# Patient Record
Sex: Female | Born: 1945 | Race: White | Hispanic: No | Smoking: Never smoker
Health system: Southern US, Community
[De-identification: ages and names within clinical notes are randomized; demographics above are authoritative.]

## PROBLEM LIST (undated history)

## (undated) DIAGNOSIS — E119 Type 2 diabetes mellitus without complications: Secondary | ICD-10-CM

## (undated) DIAGNOSIS — T884XXA Failed or difficult intubation, initial encounter: Secondary | ICD-10-CM

## (undated) DIAGNOSIS — M1990 Unspecified osteoarthritis, unspecified site: Secondary | ICD-10-CM

## (undated) DIAGNOSIS — K219 Gastro-esophageal reflux disease without esophagitis: Secondary | ICD-10-CM

## (undated) DIAGNOSIS — J45909 Unspecified asthma, uncomplicated: Secondary | ICD-10-CM

## (undated) DIAGNOSIS — E785 Hyperlipidemia, unspecified: Secondary | ICD-10-CM

## (undated) DIAGNOSIS — I10 Essential (primary) hypertension: Secondary | ICD-10-CM

## (undated) DIAGNOSIS — R011 Cardiac murmur, unspecified: Secondary | ICD-10-CM

## (undated) DIAGNOSIS — Z8639 Personal history of other endocrine, nutritional and metabolic disease: Secondary | ICD-10-CM

## (undated) DIAGNOSIS — Z8719 Personal history of other diseases of the digestive system: Secondary | ICD-10-CM

## (undated) DIAGNOSIS — E039 Hypothyroidism, unspecified: Secondary | ICD-10-CM

## (undated) HISTORY — PX: CATARACT EXTRACTION: SUR2

---

## 2007-11-12 ENCOUNTER — Ambulatory Visit: Payer: Self-pay | Admitting: Internal Medicine

## 2007-11-23 ENCOUNTER — Ambulatory Visit: Payer: Self-pay | Admitting: Otolaryngology

## 2008-11-14 ENCOUNTER — Ambulatory Visit: Payer: Self-pay | Admitting: Internal Medicine

## 2009-03-22 ENCOUNTER — Ambulatory Visit: Payer: Self-pay | Admitting: General Practice

## 2009-11-30 ENCOUNTER — Ambulatory Visit: Payer: Self-pay | Admitting: Internal Medicine

## 2010-12-04 ENCOUNTER — Ambulatory Visit: Payer: Self-pay | Admitting: Internal Medicine

## 2011-12-05 ENCOUNTER — Ambulatory Visit: Payer: Self-pay | Admitting: Internal Medicine

## 2012-12-08 ENCOUNTER — Ambulatory Visit: Payer: Self-pay | Admitting: Internal Medicine

## 2012-12-29 ENCOUNTER — Ambulatory Visit: Payer: Self-pay | Admitting: Internal Medicine

## 2013-01-28 ENCOUNTER — Ambulatory Visit: Payer: Self-pay | Admitting: Internal Medicine

## 2013-02-28 ENCOUNTER — Ambulatory Visit: Payer: Self-pay | Admitting: Internal Medicine

## 2013-12-09 ENCOUNTER — Ambulatory Visit: Payer: Self-pay | Admitting: Internal Medicine

## 2014-05-19 DIAGNOSIS — E039 Hypothyroidism, unspecified: Secondary | ICD-10-CM | POA: Insufficient documentation

## 2014-05-19 DIAGNOSIS — E1169 Type 2 diabetes mellitus with other specified complication: Secondary | ICD-10-CM | POA: Insufficient documentation

## 2014-05-19 DIAGNOSIS — J302 Other seasonal allergic rhinitis: Secondary | ICD-10-CM | POA: Insufficient documentation

## 2014-05-19 DIAGNOSIS — E559 Vitamin D deficiency, unspecified: Secondary | ICD-10-CM | POA: Insufficient documentation

## 2014-05-20 DIAGNOSIS — J310 Chronic rhinitis: Secondary | ICD-10-CM | POA: Insufficient documentation

## 2014-05-20 DIAGNOSIS — T485X5A Adverse effect of other anti-common-cold drugs, initial encounter: Secondary | ICD-10-CM | POA: Insufficient documentation

## 2014-12-13 ENCOUNTER — Ambulatory Visit: Payer: Self-pay | Admitting: Internal Medicine

## 2015-11-27 ENCOUNTER — Other Ambulatory Visit: Payer: Self-pay | Admitting: Internal Medicine

## 2015-11-27 DIAGNOSIS — Z1231 Encounter for screening mammogram for malignant neoplasm of breast: Secondary | ICD-10-CM

## 2015-12-14 ENCOUNTER — Ambulatory Visit
Admission: RE | Admit: 2015-12-14 | Discharge: 2015-12-14 | Disposition: A | Discharge status: Home / Self Care | Payer: Medicare Other | Source: Ambulatory Visit | Attending: Internal Medicine | Admitting: Internal Medicine

## 2015-12-14 DIAGNOSIS — Z1231 Encounter for screening mammogram for malignant neoplasm of breast: Secondary | ICD-10-CM | POA: Diagnosis not present

## 2016-05-28 DIAGNOSIS — E1159 Type 2 diabetes mellitus with other circulatory complications: Secondary | ICD-10-CM | POA: Insufficient documentation

## 2016-05-28 DIAGNOSIS — I152 Hypertension secondary to endocrine disorders: Secondary | ICD-10-CM | POA: Insufficient documentation

## 2016-07-29 DIAGNOSIS — Z79899 Other long term (current) drug therapy: Secondary | ICD-10-CM | POA: Insufficient documentation

## 2016-11-27 ENCOUNTER — Other Ambulatory Visit: Payer: Self-pay | Admitting: Internal Medicine

## 2016-11-27 DIAGNOSIS — Z1231 Encounter for screening mammogram for malignant neoplasm of breast: Secondary | ICD-10-CM

## 2016-12-16 ENCOUNTER — Ambulatory Visit
Admission: RE | Admit: 2016-12-16 | Discharge: 2016-12-16 | Disposition: A | Discharge status: Home / Self Care | Payer: Medicare Other | Source: Ambulatory Visit | Attending: Internal Medicine | Admitting: Internal Medicine

## 2016-12-16 DIAGNOSIS — Z1231 Encounter for screening mammogram for malignant neoplasm of breast: Secondary | ICD-10-CM | POA: Diagnosis present

## 2017-11-26 ENCOUNTER — Other Ambulatory Visit: Payer: Self-pay | Admitting: Internal Medicine

## 2017-11-26 DIAGNOSIS — Z1239 Encounter for other screening for malignant neoplasm of breast: Secondary | ICD-10-CM

## 2017-11-29 DIAGNOSIS — Z803 Family history of malignant neoplasm of breast: Secondary | ICD-10-CM | POA: Insufficient documentation

## 2017-12-17 ENCOUNTER — Ambulatory Visit
Admission: RE | Admit: 2017-12-17 | Discharge: 2017-12-17 | Disposition: A | Discharge status: Home / Self Care | Payer: Medicare Other | Source: Ambulatory Visit | Attending: Internal Medicine | Admitting: Internal Medicine

## 2017-12-17 DIAGNOSIS — Z1239 Encounter for other screening for malignant neoplasm of breast: Secondary | ICD-10-CM

## 2017-12-17 DIAGNOSIS — Z1231 Encounter for screening mammogram for malignant neoplasm of breast: Secondary | ICD-10-CM | POA: Insufficient documentation

## 2017-12-31 DIAGNOSIS — H50011 Monocular esotropia, right eye: Secondary | ICD-10-CM | POA: Insufficient documentation

## 2018-06-03 DIAGNOSIS — G5603 Carpal tunnel syndrome, bilateral upper limbs: Secondary | ICD-10-CM | POA: Insufficient documentation

## 2018-10-29 ENCOUNTER — Other Ambulatory Visit: Payer: Self-pay | Admitting: Physician Assistant

## 2018-10-29 ENCOUNTER — Other Ambulatory Visit (HOSPITAL_COMMUNITY): Payer: Self-pay | Admitting: Physician Assistant

## 2018-10-29 DIAGNOSIS — H918X3 Other specified hearing loss, bilateral: Secondary | ICD-10-CM

## 2018-10-29 DIAGNOSIS — H918X9 Other specified hearing loss, unspecified ear: Secondary | ICD-10-CM

## 2018-11-10 ENCOUNTER — Ambulatory Visit: Payer: Medicare Other

## 2018-11-10 ENCOUNTER — Ambulatory Visit
Admission: RE | Admit: 2018-11-10 | Discharge: 2018-11-10 | Disposition: A | Discharge status: Home / Self Care | Payer: Medicare Other | Source: Ambulatory Visit | Attending: Physician Assistant | Admitting: Physician Assistant

## 2018-11-10 DIAGNOSIS — H918X9 Other specified hearing loss, unspecified ear: Secondary | ICD-10-CM | POA: Diagnosis not present

## 2018-12-03 ENCOUNTER — Other Ambulatory Visit: Payer: Self-pay | Admitting: Internal Medicine

## 2018-12-03 DIAGNOSIS — Z1231 Encounter for screening mammogram for malignant neoplasm of breast: Secondary | ICD-10-CM

## 2018-12-21 ENCOUNTER — Ambulatory Visit: Payer: Medicare Other

## 2019-01-25 ENCOUNTER — Ambulatory Visit: Payer: Medicare Other

## 2019-03-02 ENCOUNTER — Other Ambulatory Visit: Payer: Self-pay

## 2019-03-02 ENCOUNTER — Ambulatory Visit
Admission: RE | Admit: 2019-03-02 | Discharge: 2019-03-02 | Disposition: A | Discharge status: Home / Self Care | Payer: Medicare Other | Source: Ambulatory Visit | Attending: Internal Medicine | Admitting: Internal Medicine

## 2019-03-02 DIAGNOSIS — Z1231 Encounter for screening mammogram for malignant neoplasm of breast: Secondary | ICD-10-CM | POA: Diagnosis present

## 2019-12-09 ENCOUNTER — Other Ambulatory Visit: Payer: Self-pay | Admitting: Internal Medicine

## 2019-12-09 DIAGNOSIS — Z1231 Encounter for screening mammogram for malignant neoplasm of breast: Secondary | ICD-10-CM

## 2020-03-02 ENCOUNTER — Other Ambulatory Visit: Payer: Self-pay

## 2020-03-02 ENCOUNTER — Ambulatory Visit
Admission: RE | Admit: 2020-03-02 | Discharge: 2020-03-02 | Disposition: A | Discharge status: Home / Self Care | Payer: Medicare PPO | Source: Ambulatory Visit | Attending: Internal Medicine | Admitting: Internal Medicine

## 2020-03-02 DIAGNOSIS — Z1231 Encounter for screening mammogram for malignant neoplasm of breast: Secondary | ICD-10-CM | POA: Diagnosis not present

## 2020-12-07 ENCOUNTER — Other Ambulatory Visit: Payer: Self-pay | Admitting: Internal Medicine

## 2020-12-07 DIAGNOSIS — Z1231 Encounter for screening mammogram for malignant neoplasm of breast: Secondary | ICD-10-CM

## 2021-03-05 ENCOUNTER — Other Ambulatory Visit: Payer: Self-pay

## 2021-03-05 ENCOUNTER — Ambulatory Visit
Admission: RE | Admit: 2021-03-05 | Discharge: 2021-03-05 | Disposition: A | Discharge status: Home / Self Care | Payer: Medicare PPO | Source: Ambulatory Visit | Attending: Internal Medicine | Admitting: Internal Medicine

## 2021-03-05 DIAGNOSIS — Z1231 Encounter for screening mammogram for malignant neoplasm of breast: Secondary | ICD-10-CM | POA: Diagnosis not present

## 2021-09-01 IMAGING — MG MM DIGITAL SCREENING BILAT W/ TOMO AND CAD
8 series · 8 of 24 positions shown · non-contrast
Comparison: Previous exam(s).

CLINICAL DATA: Screening.

EXAM:
DIGITAL SCREENING BILATERAL MAMMOGRAM WITH TOMOSYNTHESIS AND CAD
TECHNIQUE: Bilateral screening digital craniocaudal and mediolateral oblique
mammograms were obtained. Bilateral screening digital breast
tomosynthesis was performed. The images were evaluated with
computer-aided detection.

[L CC synth-2D]
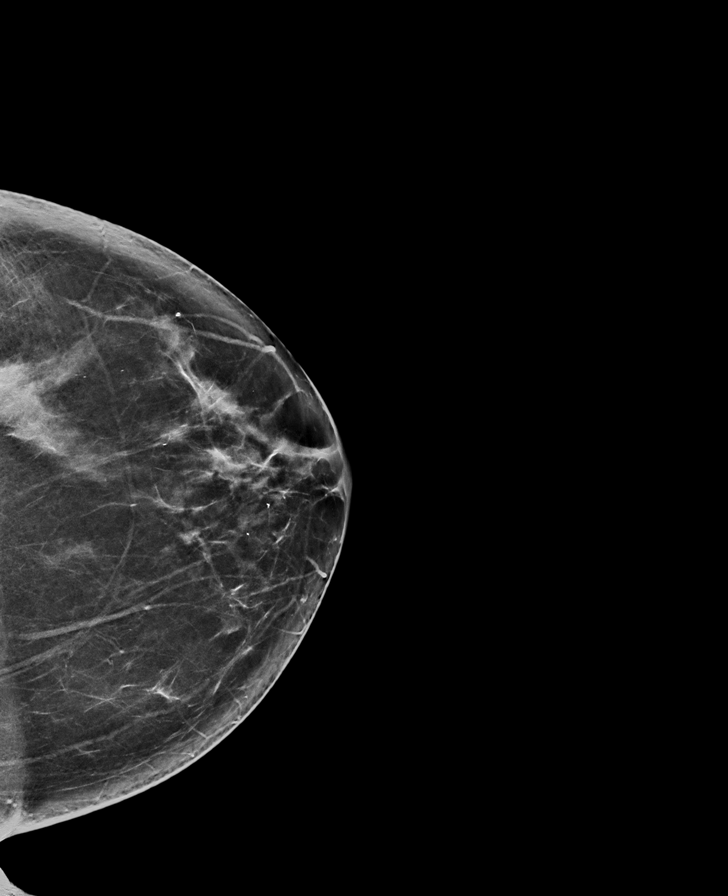

[L MLO synth-2D]
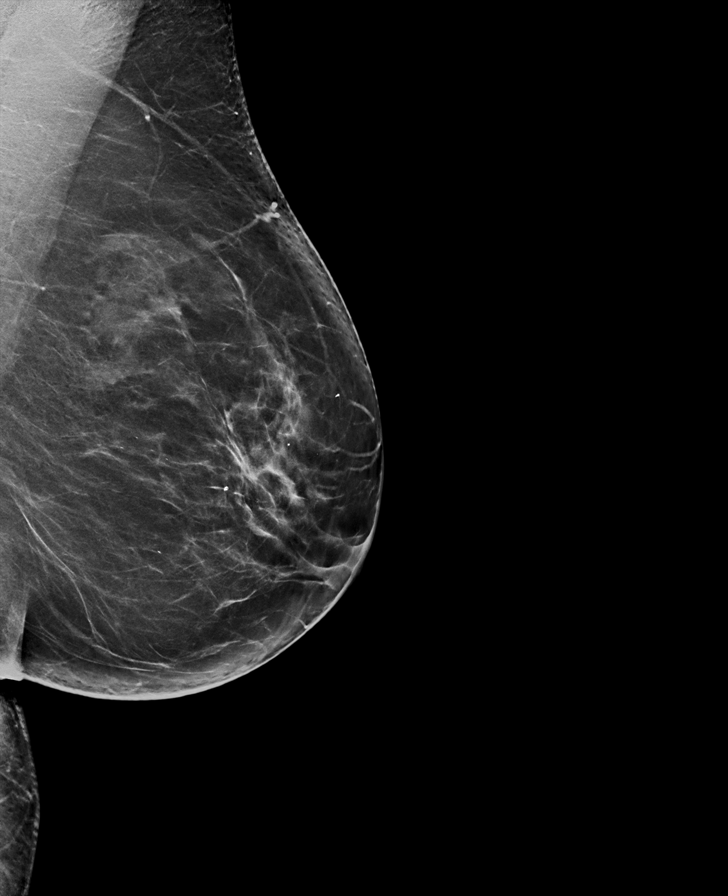

[R MLO synth-2D]
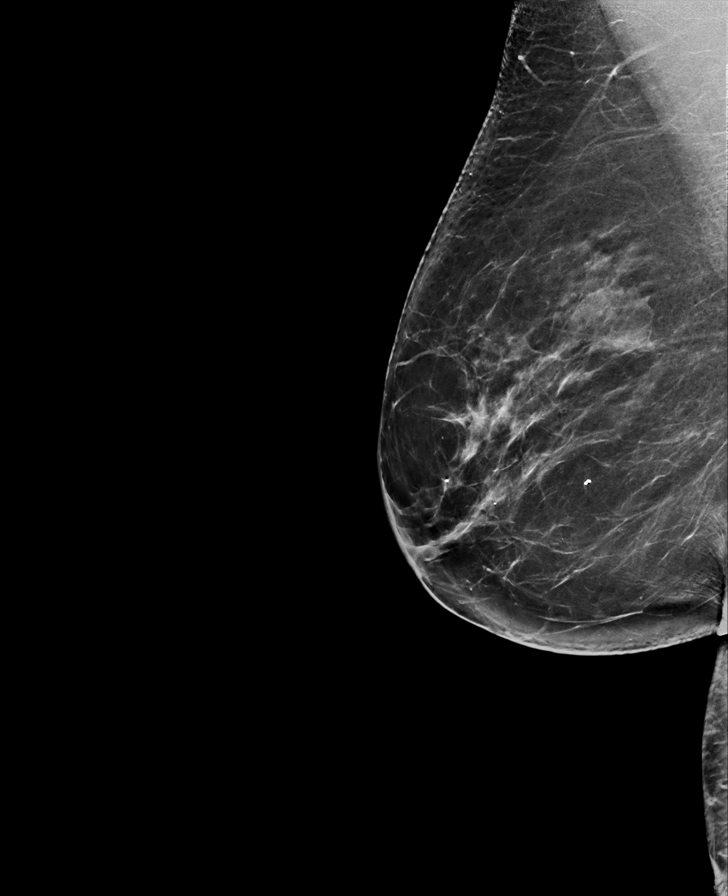

[R CC synth-2D]
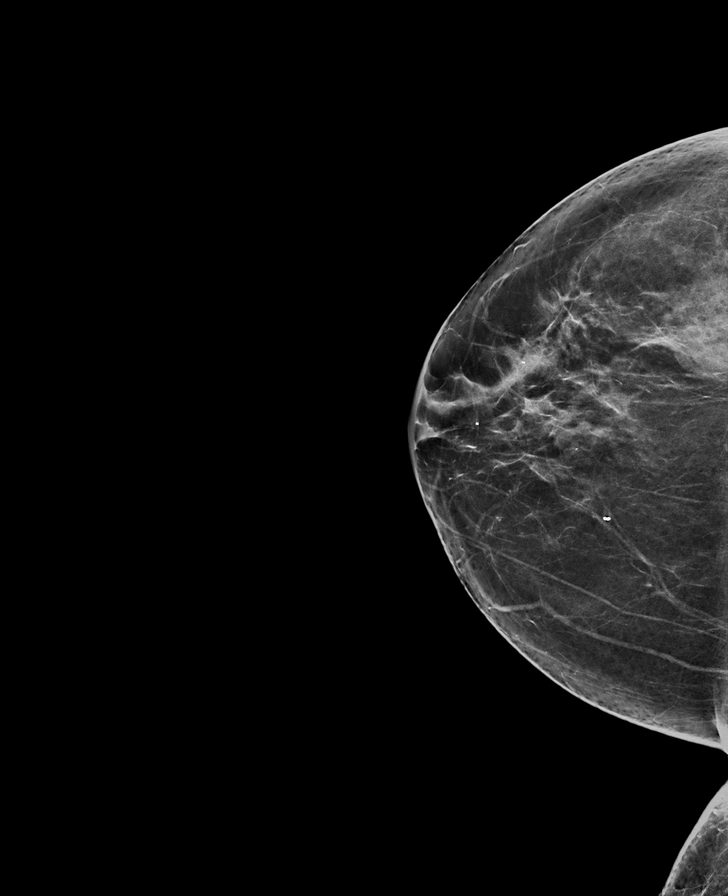

[R CC tomo · tomo slice 35/68.0]
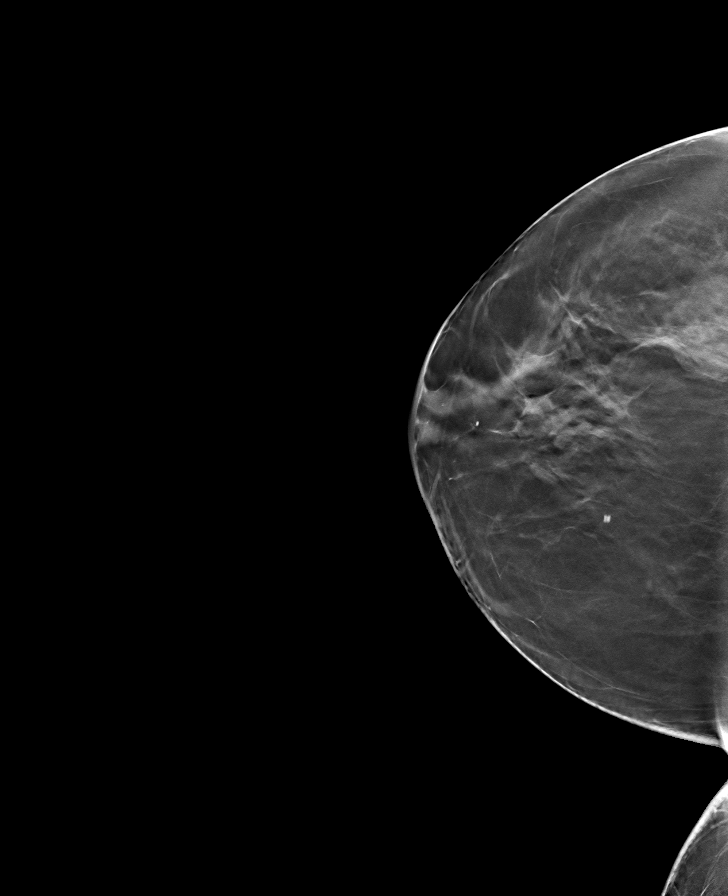

[R MLO tomo · tomo slice 40/79.0]
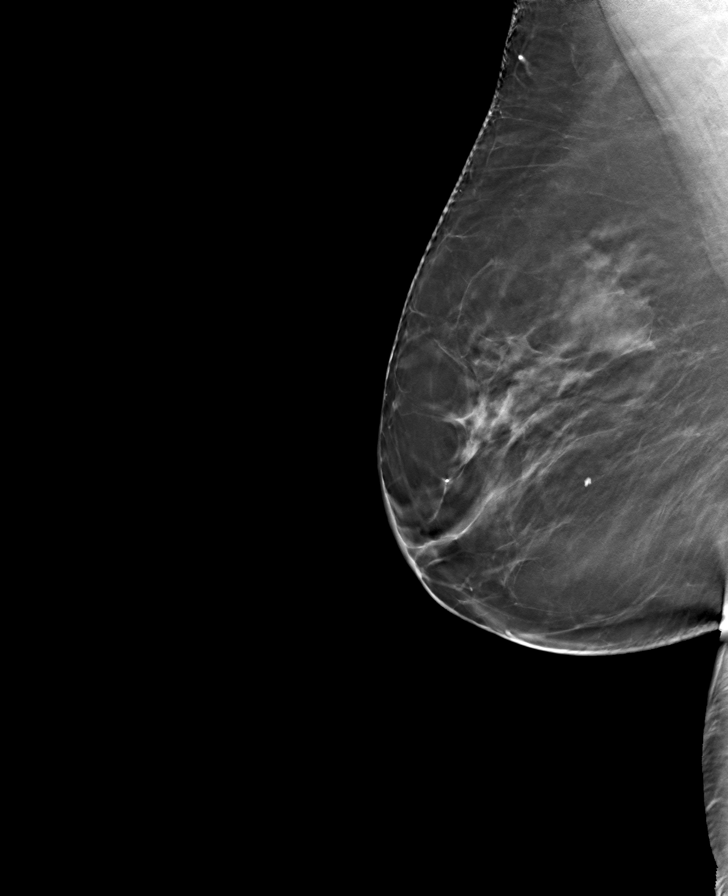

[L MLO tomo · tomo slice 41/81.0]
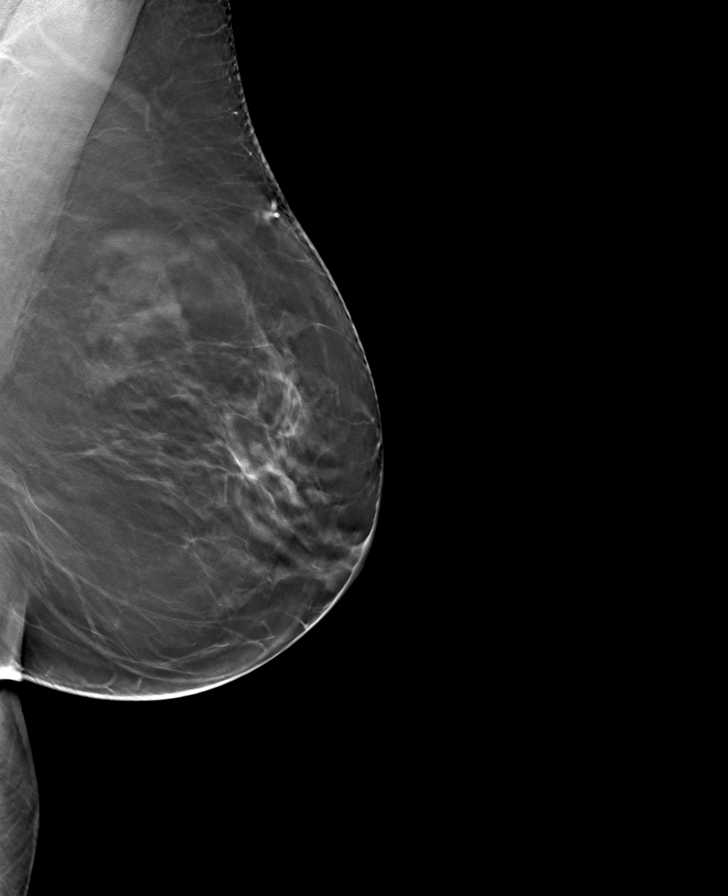

[L CC tomo · tomo slice 38/75.0]
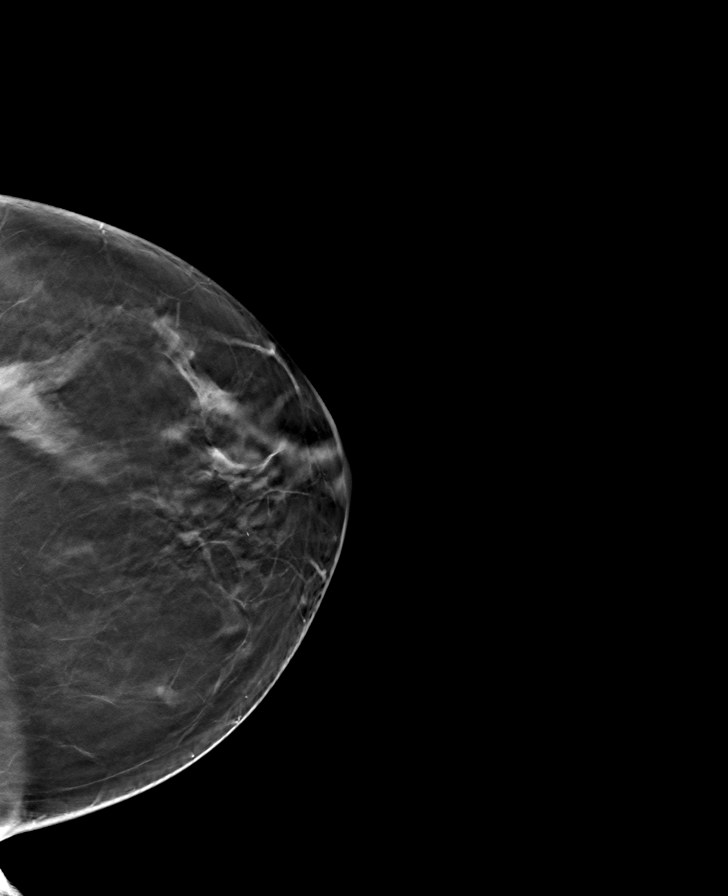

[8 of 24 positions shown; findings below may reference images not displayed]

ACR Breast Density Category b: There are scattered areas of
fibroglandular density.
FINDINGS: There are no findings suspicious for malignancy. The images were
evaluated with computer-aided detection.
IMPRESSION: No mammographic evidence of malignancy. A result letter of this
screening mammogram will be mailed directly to the patient.

RECOMMENDATION:
Screening mammogram in one year. (Code:[id number])

BI-RADS CATEGORY  1: Negative.

## 2021-09-30 DIAGNOSIS — M1612 Unilateral primary osteoarthritis, left hip: Secondary | ICD-10-CM | POA: Insufficient documentation

## 2021-12-01 NOTE — Discharge Instructions (Signed)
Instructions after Total Hip Replacement     Tina Best, Jr., M.D.     Dept. of Orthopaedics & Sports Medicine  [facility]  [address]  [place], [place]  [address]  Phone: [phone]   Fax: [phone]    DIET: . Drink plenty of non-alcoholic fluids. . Resume your normal diet. Include foods high in fiber.  ACTIVITY:  . You may use crutches or a walker with weight-bearing as tolerated, unless instructed otherwise. . You may be weaned off of the walker or crutches by your Physical Therapist.  . Do NOT reach below the level of your knees or cross your legs until allowed.    . Continue doing gentle exercises. Exercising will reduce the pain and swelling, increase motion, and prevent muscle weakness.   . Please continue to use the TED compression stockings for 6 weeks. You may remove the stockings at night, but should reapply them in the morning. . Do not drive or operate any equipment until instructed.  WOUND CARE:  . Continue to use ice packs periodically to reduce pain and swelling. . Keep the incision clean and dry. . You may bathe or shower after the staples are removed at the first office visit following surgery.  MEDICATIONS: . You may resume your regular medications. . Please take the pain medication as prescribed on the medication. . Do not take pain medication on an empty stomach. . You have been given a prescription for a blood thinner to prevent blood clots. Please take the medication as instructed. (NOTE: After completing a 2 week course of Lovenox, take one Enteric-coated aspirin once a day.) . Pain medications and iron supplements can cause constipation. Use a stool softener (Senokot or Colace) on a daily basis and a laxative (dulcolax or miralax) as needed. . Do not drive or drink alcoholic beverages when taking pain medications.  CALL THE OFFICE FOR: . Temperature above 101 degrees . Excessive bleeding or drainage on the dressing. . Excessive  swelling, coldness, or paleness of the toes. . Persistent nausea and vomiting.  FOLLOW-UP:  . You should have an appointment to return to the office in 6 weeks after surgery. . Arrangements have been made for continuation of Physical Therapy (either home therapy or outpatient therapy).     [facility] Department Directory         [email or web address]       [email or web address]          Cardiology  Appointments: [place] - [phone] [place] - [phone]  Endocrinology  Appointments: [place] - [phone] [place] - [phone]  Gastroenterology  Appointments: [place] - [phone] [place] - [phone]        General Surgery   Appointments: [place] - [phone]  Internal Medicine/Family Medicine  Appointments: [place] - [phone] [place] - [phone] [place] - [phone]  Metabolic and Weigh Loss Surgery  Appointments: [place] - [phone]        Neurology  Appointments: [place] - [phone] [place] - [phone]  Neurosurgery  Appointments: [place] - [phone]  Obstetrics & Gynecology  Appointments: [place] - [phone] [place] - [phone]        Pediatrics  Appointments: [place] - [phone] [place] - [phone]  Physiatry  Appointments: [place] -[phone]  Physical Therapy  Appointments: [place] - [phone] [place] - [phone]        Podiatry  Appointments: [place] - [phone] [place] - [phone]  Pulmonology  Appointments: [place] - [phone]  Rheumatology  Appointments: [place] - [phone]         Location: [facility]  [address] [place], [place]  [address]  [place] Location: [facility] [address] [place], [place]  [address]  [place] Location: [facility] [address] [place], [place]  [address]    

## 2021-12-10 ENCOUNTER — Encounter
Admission: RE | Admit: 2021-12-10 | Discharge: 2021-12-10 | Disposition: A | Discharge status: Home / Self Care | Payer: Medicare PPO | Source: Ambulatory Visit | Attending: Orthopedic Surgery | Admitting: Orthopedic Surgery

## 2021-12-10 ENCOUNTER — Other Ambulatory Visit: Payer: Self-pay

## 2021-12-10 DIAGNOSIS — Z01818 Encounter for other preprocedural examination: Secondary | ICD-10-CM

## 2021-12-10 HISTORY — DX: Unspecified osteoarthritis, unspecified site: M19.90

## 2021-12-10 HISTORY — DX: Cardiac murmur, unspecified: R01.1

## 2021-12-10 HISTORY — DX: Failed or difficult intubation, initial encounter: T88.4XXA

## 2021-12-10 HISTORY — DX: Gastro-esophageal reflux disease without esophagitis: K21.9

## 2021-12-10 HISTORY — DX: Personal history of other endocrine, nutritional and metabolic disease: Z86.39

## 2021-12-10 HISTORY — DX: Hyperlipidemia, unspecified: E78.5

## 2021-12-10 HISTORY — DX: Essential (primary) hypertension: I10

## 2021-12-10 HISTORY — DX: Personal history of other diseases of the digestive system: Z87.19

## 2021-12-10 HISTORY — DX: Hypothyroidism, unspecified: E03.9

## 2021-12-10 HISTORY — DX: Unspecified asthma, uncomplicated: J45.909

## 2021-12-10 HISTORY — DX: Type 2 diabetes mellitus without complications: E11.9

## 2021-12-10 NOTE — Patient Instructions (Addendum)
Your procedure is scheduled on:12-24-21 Monday ?Report to the Registration Desk on the 1st floor of the Medical Mall.Then proceed to the 2nd floor Surgery Desk in the Medical Mall ?To find out your arrival time, please call [phone] between 1PM - 3PM on:12-21-21 Friday ? ?REMEMBER: ?Instructions that are not followed completely may result in serious medical risk, up to and including death; or upon the discretion of your surgeon and anesthesiologist your surgery may need to be rescheduled. ? ?Do not eat food after midnight the night before surgery.  ?No gum chewing, lozengers or hard candies. ? ?You may however, drink Water up to 2 hours before you are scheduled to arrive for your surgery. Do not drink anything within 2 hours of your scheduled arrival time. ? ?Type 1 and Type 2 diabetics should only drink water. ? ?In addition, your doctor has ordered for you to drink the provided  ?Gatorade G2 ?Drinking this carbohydrate drink up to two hours before surgery helps to reduce insulin resistance and improve patient outcomes. Please complete drinking 2 hours prior to scheduled arrival time. ? ?TAKE THESE MEDICATIONS THE MORNING OF SURGERY WITH A SIP OF WATER: ?-levothyroxine (SYNTHROID) ?-loratadine (CLARITIN) ? ?Stop your metFORMIN (GLUCOPHAGE-XR) 2 days prior to surgery-Last dose on 12-21-21 Friday ? ?Stop your Aspirin 7 days prior to surgery-Last dose will be on 12-16-21 Sunday ? ?One week prior to surgery:Last dose on 12-16-21 ?Stop Anti-inflammatories (NSAIDS) such as Advil, Aleve, Ibuprofen, Motrin, Naproxen, Naprosyn and Aspirin based products such as Excedrin, Goodys Powder, BC Powder.You may however, continue to take Tylenol if needed for pain up until the day of surgery. ? ?Stop ANY OVER THE COUNTER supplements/vitamins 7 days prior to surgery (Calcium Carbonate-Vitamin D, VITAMIN D3, Lysine , Multiple Vitamin , Omega-3 Fatty Acids (FISH OIL), Vitamin E ) ? ?No Alcohol for 24 hours before or after  surgery. ? ?No Smoking including e-cigarettes for 24 hours prior to surgery.  ?No chewable tobacco products for at least 6 hours prior to surgery.  ?No nicotine patches on the day of surgery. ? ?Do not use any "recreational" drugs for at least a week prior to your surgery.  ?Please be advised that the combination of cocaine and anesthesia may have negative outcomes, up to and including death. ?If you test positive for cocaine, your surgery will be cancelled. ? ?On the morning of surgery brush your teeth with toothpaste and water, you may rinse your mouth with mouthwash if you wish. ?Do not swallow any toothpaste or mouthwash. ? ?Use CHG Soap as directed on instruction sheet. ? ?Do not wear jewelry, make-up, hairpins, clips or nail polish. ? ?Do not wear lotions, powders, or perfumes.  ? ?Do not shave body from the neck down 48 hours prior to surgery just in case you cut yourself which could leave a site for infection.  ?Also, freshly shaved skin may become irritated if using the CHG soap. ? ?Contact lenses, hearing aids and dentures may not be worn into surgery. ? ?Do not bring valuables to the hospital. [facility] is not responsible for any missing/lost belongings or valuables.  ? ?Notify your doctor if there is any change in your medical condition (cold, fever, infection). ? ?Wear comfortable clothing (specific to your surgery type) to the hospital. ? ?After surgery, you can help prevent lung complications by doing breathing exercises.  ?Take deep breaths and cough every 1-2 hours. Your doctor may order a device called an Incentive Spirometer to help you take deep breaths. ?When  coughing or sneezing, hold a pillow firmly against your incision with both hands. This is called ?splinting.? Doing this helps protect your incision. It also decreases belly discomfort. ? ?If you are being admitted to the hospital overnight, leave your suitcase in the car. ?After surgery it may be brought to your room. ? ?If you are being  discharged the day of surgery, you will not be allowed to drive home. ?You will need a responsible adult (18 years or older) to drive you home and stay with you that night.  ? ?If you are taking public transportation, you will need to have a responsible adult (18 years or older) with you. ?Please confirm with your physician that it is acceptable to use public transportation.  ? ?Please call the Pre-admissions Testing Dept. at [phone] if you have any questions about these instructions. ? ?Surgery Visitation Policy: ? ?Patients undergoing a surgery or procedure may have one family member or support person with them as long as that person is not COVID-19 positive or experiencing its symptoms.  ?That person may remain in the waiting area during the procedure and may rotate out with other people. ? ?Inpatient Visitation:   ? ?Visiting hours are 7 a.m. to 8 p.m. ?Up to two visitors ages 16+ are allowed at one time in a patient room. The visitors may rotate out with other people during the day. Visitors must check out when they leave, or other visitors will not be allowed. One designated support person may remain overnight. ?The visitor must pass COVID-19 screenings, use hand sanitizer when entering and exiting the patient?s room and wear a mask at all times, including in the patient?s room. ?Patients must also wear a mask when staff or their visitor are in the room. ?Masking is required regardless of vaccination status.  ?

## 2021-12-11 ENCOUNTER — Other Ambulatory Visit
Admission: RE | Admit: 2021-12-11 | Discharge: 2021-12-11 | Disposition: A | Discharge status: Home / Self Care | Payer: Medicare PPO | Source: Ambulatory Visit | Attending: Orthopedic Surgery | Admitting: Orthopedic Surgery

## 2021-12-11 DIAGNOSIS — Z01818 Encounter for other preprocedural examination: Secondary | ICD-10-CM

## 2021-12-11 DIAGNOSIS — Z01812 Encounter for preprocedural laboratory examination: Secondary | ICD-10-CM | POA: Insufficient documentation

## 2021-12-11 DIAGNOSIS — M1612 Unilateral primary osteoarthritis, left hip: Secondary | ICD-10-CM | POA: Insufficient documentation

## 2021-12-11 DIAGNOSIS — E119 Type 2 diabetes mellitus without complications: Secondary | ICD-10-CM | POA: Insufficient documentation

## 2021-12-11 DIAGNOSIS — Z01810 Encounter for preprocedural cardiovascular examination: Secondary | ICD-10-CM | POA: Diagnosis not present

## 2021-12-11 LAB — URINALYSIS, ROUTINE W REFLEX MICROSCOPIC
Bilirubin Urine: NEGATIVE
Glucose, UA: NEGATIVE mg/dL
Hgb urine dipstick: NEGATIVE
Ketones, ur: NEGATIVE mg/dL
Leukocytes,Ua: NEGATIVE
Nitrite: NEGATIVE
Protein, ur: NEGATIVE mg/dL
Specific Gravity, Urine: 1.021 (ref 1.005–1.030)
pH: 5 (ref 5.0–8.0)

## 2021-12-11 LAB — COMPREHENSIVE METABOLIC PANEL
ALT: 15 U/L (ref 0–44)
AST: 17 U/L (ref 15–41)
Albumin: 4.2 g/dL (ref 3.5–5.0)
Alkaline Phosphatase: 62 U/L (ref 38–126)
Anion gap: 10 (ref 5–15)
BUN: 33 mg/dL — ABNORMAL HIGH (ref 8–23)
CO2: 24 mmol/L (ref 22–32)
Calcium: 9.8 mg/dL (ref 8.9–10.3)
Chloride: 102 mmol/L (ref 98–111)
Creatinine, Ser: 1.4 mg/dL — ABNORMAL HIGH (ref 0.44–1.00)
GFR, Estimated: 39 mL/min — ABNORMAL LOW (ref >60)
Glucose, Bld: 138 mg/dL — ABNORMAL HIGH (ref 70–99)
Potassium: 3.9 mmol/L (ref 3.5–5.1)
Sodium: 136 mmol/L (ref 135–145)
Total Bilirubin: 0.4 mg/dL (ref 0.3–1.2)
Total Protein: 7.1 g/dL (ref 6.5–8.1)

## 2021-12-11 LAB — CBC WITH DIFFERENTIAL/PLATELET
Abs Immature Granulocytes: 0.03 10*3/uL (ref 0.00–0.07)
Basophils Absolute: 0 10*3/uL (ref 0.0–0.1)
Basophils Relative: 0 %
Eosinophils Absolute: 0.3 10*3/uL (ref 0.0–0.5)
Eosinophils Relative: 3 %
HCT: 35 % — ABNORMAL LOW (ref 36.0–46.0)
Hemoglobin: 11.5 g/dL — ABNORMAL LOW (ref 12.0–15.0)
Immature Granulocytes: 0 %
Lymphocytes Relative: 29 %
Lymphs Abs: 2.8 10*3/uL (ref 0.7–4.0)
MCH: 31.1 pg (ref 26.0–34.0)
MCHC: 32.9 g/dL (ref 30.0–36.0)
MCV: 94.6 fL (ref 80.0–100.0)
Monocytes Absolute: 0.7 10*3/uL (ref 0.1–1.0)
Monocytes Relative: 8 %
Neutro Abs: 5.8 10*3/uL (ref 1.7–7.7)
Neutrophils Relative %: 60 %
Platelets: 316 10*3/uL (ref 150–400)
RBC: 3.7 MIL/uL — ABNORMAL LOW (ref 3.87–5.11)
RDW: 13.2 % (ref 11.5–15.5)
WBC: 9.7 10*3/uL (ref 4.0–10.5)
nRBC: 0 % (ref 0.0–0.2)

## 2021-12-11 LAB — TYPE AND SCREEN
ABO/RH(D): A POS
Antibody Screen: NEGATIVE

## 2021-12-11 LAB — SEDIMENTATION RATE: Sed Rate: 2 mm/hr (ref 0–30)

## 2021-12-11 LAB — SURGICAL PCR SCREEN
MRSA, PCR: NEGATIVE
Staphylococcus aureus: POSITIVE — AB

## 2021-12-11 LAB — C-REACTIVE PROTEIN: CRP: 0.8 mg/dL (ref <1.0)

## 2021-12-12 LAB — HEMOGLOBIN A1C
Hgb A1c MFr Bld: 7.2 % — ABNORMAL HIGH (ref 4.8–5.6)
Mean Plasma Glucose: 160 mg/dL

## 2021-12-17 ENCOUNTER — Other Ambulatory Visit: Payer: Medicare PPO

## 2021-12-21 ENCOUNTER — Other Ambulatory Visit: Admission: RE | Admit: 2021-12-21 | Payer: Medicare PPO | Source: Ambulatory Visit

## 2021-12-23 ENCOUNTER — Encounter: Payer: Self-pay | Admitting: Orthopedic Surgery

## 2021-12-23 DIAGNOSIS — H25019 Cortical age-related cataract, unspecified eye: Secondary | ICD-10-CM | POA: Insufficient documentation

## 2021-12-23 NOTE — H&P (Signed)
ORTHOPAEDIC HISTORY & PHYSICAL ?Tina Best, Tina Best, [name] - 12/13/2021 9:45 AM EDT ?Formatting of this note is different from the original. ?[facility] ?ORTHOPAEDICS AND SPORTS MEDICINE ?Chief Complaint:  ? ?Chief Complaint  ?Patient presents with  ? Hip Pain  ?H & P LEFT HIP  ? ?History of Present Illness:  ? ?Tina Best is a 76 y.o. female that presents to clinic today for her preoperative history and evaluation. The patient is scheduled to undergo a left total hip arthroplasty on 12/24/21 by Dr. Ernest Best. Her pain began several years ago with worsening over the last several months. The pain is located primarily in the left hip. She describes her pain as worse with weightbearing. She reports associated difficulty removing and putting on her shoes and socks. She denies associated numbness or tingling.  ? ?The patient's symptoms have progressed to the point that they decrease her quality of life. The patient has previously undergone conservative treatment including NSAIDS and activity modification without adequate control of her symptoms. ? ?Denies significant cardiac history, history of DVT, or lumbar surgery.  ? ?Past Medical, Surgical, Family, Social History, Allergies, Medications:  ? ?Past Medical History:  ?Past Medical History:  ?Diagnosis Date  ? Allergy about 1960  ?"hay fever" & allergic to Breathine  ? Arthritis 2015  ?left knee  ? Cataract cortical, senile  ?BILATERAL  ? Chicken pox  ? Essential hypertension 05/27/2016  ?Started on medication 05/27/2016  ? GERD (gastroesophageal reflux disease)  ?infrequent  ? History of Hashimoto thyroiditis  ? History of herpes zoster  ? History of hyperparathyroidism  ? Hyperlipidemia  ? Hypothyroidism (acquired)  ? Perennial allergic rhinitis  ? Type 2 diabetes mellitus (CMS-HCC)  ? Vitamin D deficiency 05/19/2014  ? ?Past Surgical History:  ?Past Surgical History:  ?Procedure Laterality Date  ? TONSILLECTOMY 1967  ? RADIAL Keratotomy 1985  ?  PARATHYROIDECTOMY 2009  ? RIGHT TRIGGER THUMB RELEASE 03/22/2009  ? CATARACT EXTRACTION Jan. 2019  ?[facility]  ? EXTRACTION CATARACT EXTRACAPSULAR Right 10/14/2017  ?Procedure: Lensx/ TORIC-----EXTRACTION CATARACT EXTRACAPSULAR WITH PHACO; Surgeon: Tina Best, Terry, MD; [facility]; Service: Ophthalmology; Laterality: Right; [phone] with trypan blue and possible MiLoop  ? EXTRACTION CATARACT EXTRACAPSULAR Left 10/28/2017  ?Procedure: Lensx/ TORIC-----EXTRACTION CATARACT EXTRACAPSULAR WITH PHACO; Surgeon: Tina Best, Terry, MD; [facility]; Service: Ophthalmology; Laterality: Left; [id number] with trypan blue and possible MiLoop  ? ?Current Medications:  ?Current Outpatient Medications  ?Medication Sig Dispense Refill  ? aspirin 81 MG EC tablet Take 81 mg by mouth once daily.  ? blood glucose diagnostic test strip Use once daily. Use as instructed. 100 each 3  ? CALCIUM CARBONATE (TUMS ORAL) Take 3 tablets by mouth as needed  ? CALCIUM CARBONATE/VITAMIN D3 (CALTRATE-600 + D VIT D3, 800, ORAL) Take 1 tablet by mouth once daily.  ? cholecalciferol (VITAMIN D3) 1,000 unit capsule Take 1,000 Units by mouth 2 (two) times daily.  ? hydroCHLOROthiazide (HYDRODIURIL) 25 MG tablet Take 1 tablet (25 mg total) by mouth once daily for blood pressure 90 tablet 3  ? ibuprofen (MOTRIN) 200 MG tablet Take 200 mg by mouth 5 IN THE AM, 2 AS NEEDED THROUGHOUT THE DAY  ? lancets 30 gauge Misc Use once daily.  ? levothyroxine (SYNTHROID) 112 MCG tablet Take on an empty stomach at least 30-60 minutes before breakfast. 90 tablet 3  ? loperamide (IMODIUM A-D) 2 mg tablet Take 2 mg by mouth as needed for Diarrhea  ? loratadine (CLARITIN) 10  mg tablet Take 10 mg by mouth once daily as needed for Allergies.  ? lysine 500 mg Tab Take by mouth once daily.  ? metFORMIN (GLUCOPHAGE-XR) 500 MG XR tablet Take 2 tablets (1,000 mg total) by mouth 2 (two) times daily with meals for diabetes 360 tablet 3  ? MULTIVITS W-FE,OTHER MIN/LUT (CENTRUM  SILVER ULTRA WOMEN'S ORAL) Take by mouth once daily.  ? omega-3 fatty acids/fish oil 340-1,000 mg capsule Take 1 capsule by mouth once daily.  ? oxymetazoline (AFRIN) 0.05 % nasal spray Place 1 spray into both nostrils every 8 (eight) hours as needed for Congestion  ? peg 400-propylene glycol, PF, (SYSTANE ULTRA) 0.4-0.3 % ophthalmic drops Place 1 drop into both eyes as needed for Dry Eyes  ? simvastatin (ZOCOR) 20 MG tablet TAKE 1 TABLET (20 MG TOTAL) BY MOUTH NIGHTLY FOR CHOLESTEROL 90 tablet 3  ? telmisartan (MICARDIS) 80 MG tablet Take 1 tablet (80 mg total) by mouth once daily for blood pressure 90 tablet 3  ? vitamin E 200 UNIT capsule Take 200 Units by mouth once daily.  ? montelukast (SINGULAIR) 10 mg tablet Take 1 tablet (10 mg total) by mouth nightly as needed for allergies 90 tablet 2  ? ?No current facility-administered medications for this visit.  ? ?Allergies:  ?Allergies  ?Allergen Reactions  ? Others Other (See Comments)  ?Breathine, allergy to lyme dust med: Other Reaction: Heart "races"  ? Lisinopril Cough  ? Terbutaline Other (See Comments)  ?Heart racing  ? ?Social History:  ?Social History  ? ?Socioeconomic History  ? Marital status: Divorced  ? Number of children: 0  ? Years of education: 64  ?Occupational History  ? Occupation: retired Runner, broadcasting/film/video. working a farm  ?Tobacco Use  ? Smoking status: Never  ?Passive exposure: Yes  ? Smokeless tobacco: Never  ?Vaping Use  ? Vaping Use: Never used  ?Substance and Sexual Activity  ? Alcohol use: Never  ?Alcohol/week: 0.0 standard drinks  ? Drug use: No  ? 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 ?Family History:  ?Family History  ?Problem Relation Age of Onset  ? Brain cancer Father  ? Brain cancer Brother  ? Transient ischemic attack Mother  ? High blood pressure (Hypertension) Mother  ? Breast cancer Mother   ?deceased 31  ? Goiter Mother  ? Stroke Mother  ? Glaucoma Neg Hx  ? Macular degeneration Neg Hx  ? Anesthesia problems Neg Hx  ? ?Review of Systems:  ? ?A 10+ ROS was performed, reviewed, and the pertinent orthopaedic findings are documented in the HPI.  ? ?Physical Examination:  ? ?BP 130/70 (BP Location: Left upper arm, Patient Position: Sitting, BP Cuff Size: Adult)  Ht 172.7 cm (5\' 8" )  Wt 80.9 kg (178 lb 6.4 oz)  BMI 27.13 kg/m?  ? ?Patient is a well-developed, well-nourished female in no acute distress. Patient has normal mood and affect. Patient is alert and oriented to person, place, and time.  ? ?HEENT: Atraumatic, normocephalic. Pupils equal and reactive to light. Extraocular motion intact. Noninjected sclera. ? ?Cardiovascular: Regular rate and rhythm, with no murmurs, rubs, or gallops. Distal pulses palpable. No carotid bruits. ? ?Respiratory: Lungs clear to auscultation bilaterally.  ? ?Left Hip: ?Pelvic tilt: Negative ?Limb lengths: Equal with the patient standing ?Soft tissue swelling: Negative ?Erythema: Negative ?Crepitance: Negative ?Tenderness: Greater trochanter is nontender to palpation. Moderate pain is  elicited by axial compression or extremes of rotation. ?Atrophy: No atrophy. Fair to good hip flexor and abductor strength. ?Range of Motion: EXT/FLEX: 0/0/90 ADD/ABD: 20/0/20 IR/ER: 5/0/15  ? ?Patient able to actively plantarflex and dorsiflex the left ankle. ? ?Sensation is intact over the saphenous, lateral cutaneous, superficial fibular, and deep fibular nerve distributions. ? ?Tests Performed/Reviewed:  ?X-rays ? ?Anteroposterior view of the pelvis as well as anteroposterior and lateral views of the left hip were obtained. Images reveal severe loss of femoroacetabular joint space with significant osteophyte formation. Deformation of the femoral head with subchondral sclerosis noted. No fractures or osseous abnormality noted.  ? ?I personally ordered and interpreted today's  x-rays. ? ?Impression:  ? ?ICD-10-CM  ?1. Primary osteoarthritis of left hip M16.12  ?2. Type 2 diabetes mellitus with other specified complication, without long-term current use of insulin (CMS-HCC) E11.69  ? ?Plan:  ? ?Lowella Dandy

## 2021-12-24 ENCOUNTER — Other Ambulatory Visit: Payer: Self-pay

## 2021-12-24 ENCOUNTER — Ambulatory Visit: Payer: Medicare PPO | Admitting: Urgent Care

## 2021-12-24 ENCOUNTER — Observation Stay
Admission: RE | Admit: 2021-12-24 | Discharge: 2021-12-25 | Disposition: A | Discharge status: Home Health | Payer: Medicare PPO | Attending: Orthopedic Surgery | Admitting: Orthopedic Surgery

## 2021-12-24 ENCOUNTER — Observation Stay: Payer: Medicare PPO

## 2021-12-24 ENCOUNTER — Encounter
Admission: RE | Disposition: A | Discharge status: Home Health | Payer: Self-pay | Source: Home / Self Care | Attending: Orthopedic Surgery

## 2021-12-24 DIAGNOSIS — Z7984 Long term (current) use of oral hypoglycemic drugs: Secondary | ICD-10-CM | POA: Diagnosis not present

## 2021-12-24 DIAGNOSIS — E119 Type 2 diabetes mellitus without complications: Secondary | ICD-10-CM | POA: Diagnosis not present

## 2021-12-24 DIAGNOSIS — Z794 Long term (current) use of insulin: Secondary | ICD-10-CM | POA: Diagnosis not present

## 2021-12-24 DIAGNOSIS — E039 Hypothyroidism, unspecified: Secondary | ICD-10-CM | POA: Diagnosis not present

## 2021-12-24 DIAGNOSIS — Z79899 Other long term (current) drug therapy: Secondary | ICD-10-CM | POA: Diagnosis not present

## 2021-12-24 DIAGNOSIS — J45909 Unspecified asthma, uncomplicated: Secondary | ICD-10-CM | POA: Diagnosis not present

## 2021-12-24 DIAGNOSIS — I10 Essential (primary) hypertension: Secondary | ICD-10-CM | POA: Insufficient documentation

## 2021-12-24 DIAGNOSIS — Z96642 Presence of left artificial hip joint: Secondary | ICD-10-CM

## 2021-12-24 DIAGNOSIS — Z7982 Long term (current) use of aspirin: Secondary | ICD-10-CM | POA: Insufficient documentation

## 2021-12-24 DIAGNOSIS — M1612 Unilateral primary osteoarthritis, left hip: Secondary | ICD-10-CM | POA: Diagnosis not present

## 2021-12-24 HISTORY — PX: TOTAL HIP ARTHROPLASTY: SHX124

## 2021-12-24 LAB — GLUCOSE, CAPILLARY
Glucose-Capillary: 146 mg/dL — ABNORMAL HIGH (ref 70–99)
Glucose-Capillary: 168 mg/dL — ABNORMAL HIGH (ref 70–99)
Glucose-Capillary: 198 mg/dL — ABNORMAL HIGH (ref 70–99)
Glucose-Capillary: 168 mg/dL — ABNORMAL HIGH (ref 70–99)

## 2021-12-24 SURGERY — ARTHROPLASTY, HIP, TOTAL,POSTERIOR APPROACH
Anesthesia: Spinal | Site: Hip | Laterality: Left

## 2021-12-24 MED ORDER — GABAPENTIN 300 MG PO CAPS: 300.0000 mg | ORAL_CAPSULE | Freq: Once | ORAL | Status: AC

## 2021-12-24 MED ORDER — OXYCODONE HCL 5 MG PO TABS
5.0000 mg | ORAL_TABLET | ORAL | Status: DC | PRN
  Administered 2021-12-24: 5 mg via ORAL
  Filled 2021-12-24: qty 1

## 2021-12-24 MED ORDER — PROPOFOL 1000 MG/100ML IV EMUL
INTRAVENOUS | Status: AC
  Filled 2021-12-24: qty 100

## 2021-12-24 MED ORDER — BUPIVACAINE HCL (PF) 0.5 % IJ SOLN
INTRAMUSCULAR | Status: AC
  Filled 2021-12-24: qty 10

## 2021-12-24 MED ORDER — HYDROCHLOROTHIAZIDE 25 MG PO TABS
25.0000 mg | ORAL_TABLET | ORAL | Status: DC
  Administered 2021-12-25: 25 mg via ORAL
  Filled 2021-12-24: qty 1

## 2021-12-24 MED ORDER — VITAMIN D 25 MCG (1000 UNIT) PO TABS
1000.0000 [IU] | ORAL_TABLET | Freq: Two times a day (BID) | ORAL | Status: DC
  Administered 2021-12-24 – 2021-12-25 (×3): 1000 [IU] via ORAL
  Filled 2021-12-24 (×3): qty 1

## 2021-12-24 MED ORDER — CELECOXIB 200 MG PO CAPS
400.0000 mg | ORAL_CAPSULE | Freq: Once | ORAL | Status: AC
Start: 2021-12-24 — End: 2021-12-24

## 2021-12-24 MED ORDER — LEVOTHYROXINE SODIUM 112 MCG PO TABS
112.0000 ug | ORAL_TABLET | Freq: Every morning | ORAL | Status: DC
  Administered 2021-12-25: 112 ug via ORAL
  Filled 2021-12-24: qty 1

## 2021-12-24 MED ORDER — ACETAMINOPHEN 10 MG/ML IV SOLN
INTRAVENOUS | Status: AC
  Filled 2021-12-24: qty 100

## 2021-12-24 MED ORDER — ADULT MULTIVITAMIN W/MINERALS CH
1.0000 | ORAL_TABLET | Freq: Every day | ORAL | Status: DC
  Administered 2021-12-24 – 2021-12-25 (×2): 1 via ORAL
  Filled 2021-12-24 (×2): qty 1

## 2021-12-24 MED ORDER — EPHEDRINE 5 MG/ML INJ
INTRAVENOUS | Status: AC
  Filled 2021-12-24: qty 5

## 2021-12-24 MED ORDER — ACETAMINOPHEN 325 MG PO TABS: 325.0000 mg | ORAL_TABLET | Freq: Four times a day (QID) | ORAL | Status: DC | PRN

## 2021-12-24 MED ORDER — CEFAZOLIN SODIUM-DEXTROSE 2-4 GM/100ML-% IV SOLN
INTRAVENOUS | Status: AC
  Filled 2021-12-24: qty 100

## 2021-12-24 MED ORDER — 0.9 % SODIUM CHLORIDE (POUR BTL) OPTIME
TOPICAL | Status: DC | PRN
  Administered 2021-12-24: 500 mL

## 2021-12-24 MED ORDER — INSULIN ASPART 100 UNIT/ML IJ SOLN: 0.0000 [IU] | Freq: Every day | INTRAMUSCULAR | Status: DC

## 2021-12-24 MED ORDER — METFORMIN HCL ER 500 MG PO TB24
1000.0000 mg | ORAL_TABLET | Freq: Two times a day (BID) | ORAL | Status: DC
  Administered 2021-12-24 – 2021-12-25 (×2): 1000 mg via ORAL
  Filled 2021-12-24 (×3): qty 2

## 2021-12-24 MED ORDER — SODIUM CHLORIDE 0.9 % IV SOLN: INTRAVENOUS | Status: DC

## 2021-12-24 MED ORDER — PHENYLEPHRINE HCL-NACL 20-0.9 MG/250ML-% IV SOLN
INTRAVENOUS | Status: AC
  Filled 2021-12-24: qty 250

## 2021-12-24 MED ORDER — ONDANSETRON HCL 4 MG/2ML IJ SOLN: 4.0000 mg | Freq: Once | INTRAMUSCULAR | Status: DC | PRN

## 2021-12-24 MED ORDER — ENOXAPARIN SODIUM 30 MG/0.3ML IJ SOSY
30.0000 mg | PREFILLED_SYRINGE | Freq: Two times a day (BID) | INTRAMUSCULAR | Status: DC
  Administered 2021-12-25: 30 mg via SUBCUTANEOUS
  Filled 2021-12-24: qty 0.3

## 2021-12-24 MED ORDER — PHENYLEPHRINE HCL (PRESSORS) 10 MG/ML IV SOLN
INTRAVENOUS | Status: DC | PRN
  Administered 2021-12-24: 120 ug via INTRAVENOUS

## 2021-12-24 MED ORDER — METOCLOPRAMIDE HCL 10 MG PO TABS
10.0000 mg | ORAL_TABLET | Freq: Three times a day (TID) | ORAL | Status: DC
Start: 2021-12-24 — End: 2021-12-25
  Administered 2021-12-24 – 2021-12-25 (×4): 10 mg via ORAL
  Filled 2021-12-24 (×4): qty 1

## 2021-12-24 MED ORDER — TRANEXAMIC ACID-NACL 1000-0.7 MG/100ML-% IV SOLN
INTRAVENOUS | Status: AC
  Administered 2021-12-24: 1000 mg via INTRAVENOUS
  Filled 2021-12-24: qty 100

## 2021-12-24 MED ORDER — FLEET ENEMA 7-19 GM/118ML RE ENEM: 1.0000 | ENEMA | Freq: Once | RECTAL | Status: DC | PRN

## 2021-12-24 MED ORDER — FENTANYL CITRATE (PF) 100 MCG/2ML IJ SOLN
INTRAMUSCULAR | Status: AC
  Administered 2021-12-24: 25 ug via INTRAVENOUS
  Filled 2021-12-24: qty 2

## 2021-12-24 MED ORDER — CEFAZOLIN SODIUM-DEXTROSE 2-4 GM/100ML-% IV SOLN
2.0000 g | INTRAVENOUS | Status: AC
  Administered 2021-12-24: 2 g via INTRAVENOUS

## 2021-12-24 MED ORDER — TRANEXAMIC ACID-NACL 1000-0.7 MG/100ML-% IV SOLN
INTRAVENOUS | Status: AC
  Filled 2021-12-24: qty 100

## 2021-12-24 MED ORDER — HYDROMORPHONE HCL 1 MG/ML IJ SOLN: 0.5000 mg | INTRAMUSCULAR | Status: DC | PRN

## 2021-12-24 MED ORDER — BUPIVACAINE HCL (PF) 0.5 % IJ SOLN
INTRAMUSCULAR | Status: DC | PRN
  Administered 2021-12-24: 2.8 mL

## 2021-12-24 MED ORDER — FAMOTIDINE 20 MG PO TABS: 20.0000 mg | ORAL_TABLET | Freq: Once | ORAL | Status: AC

## 2021-12-24 MED ORDER — OXYCODONE HCL 5 MG PO TABS: 5.0000 mg | ORAL_TABLET | Freq: Once | ORAL | Status: AC

## 2021-12-24 MED ORDER — SODIUM CHLORIDE 0.9 % IR SOLN
Status: DC | PRN
Start: 2021-12-24 — End: 2021-12-24
  Administered 2021-12-24: 3000 mL

## 2021-12-24 MED ORDER — CHLORHEXIDINE GLUCONATE 0.12 % MT SOLN
OROMUCOSAL | Status: AC
  Administered 2021-12-24: 15 mL via OROMUCOSAL
  Filled 2021-12-24: qty 15

## 2021-12-24 MED ORDER — EPHEDRINE SULFATE (PRESSORS) 50 MG/ML IJ SOLN
INTRAMUSCULAR | Status: DC | PRN
  Administered 2021-12-24 (×3): 5 mg via INTRAVENOUS

## 2021-12-24 MED ORDER — CHLORHEXIDINE GLUCONATE 0.12 % MT SOLN: 15.0000 mL | Freq: Once | OROMUCOSAL | Status: AC

## 2021-12-24 MED ORDER — BISACODYL 10 MG RE SUPP: 10.0000 mg | Freq: Every day | RECTAL | Status: DC | PRN

## 2021-12-24 MED ORDER — OXYCODONE HCL 5 MG PO TABS
ORAL_TABLET | ORAL | Status: AC
  Administered 2021-12-24: 5 mg via ORAL
  Filled 2021-12-24: qty 1

## 2021-12-24 MED ORDER — SIMVASTATIN 20 MG PO TABS
20.0000 mg | ORAL_TABLET | Freq: Every day | ORAL | Status: DC
  Administered 2021-12-24: 20 mg via ORAL
  Filled 2021-12-24: qty 1

## 2021-12-24 MED ORDER — GABAPENTIN 300 MG PO CAPS
ORAL_CAPSULE | ORAL | Status: AC
  Administered 2021-12-24: 300 mg via ORAL
  Filled 2021-12-24: qty 1

## 2021-12-24 MED ORDER — FERROUS SULFATE 325 (65 FE) MG PO TABS
325.0000 mg | ORAL_TABLET | Freq: Two times a day (BID) | ORAL | Status: DC
  Administered 2021-12-24 – 2021-12-25 (×2): 325 mg via ORAL
  Filled 2021-12-24 (×2): qty 1

## 2021-12-24 MED ORDER — CELECOXIB 200 MG PO CAPS
200.0000 mg | ORAL_CAPSULE | Freq: Two times a day (BID) | ORAL | Status: DC
  Administered 2021-12-24 – 2021-12-25 (×3): 200 mg via ORAL
  Filled 2021-12-24 (×3): qty 1

## 2021-12-24 MED ORDER — CHLORHEXIDINE GLUCONATE 4 % EX LIQD: 60.0000 mL | Freq: Once | CUTANEOUS | Status: DC

## 2021-12-24 MED ORDER — PROPOFOL 500 MG/50ML IV EMUL
INTRAVENOUS | Status: DC | PRN
  Administered 2021-12-24: 80 ug/kg/min via INTRAVENOUS

## 2021-12-24 MED ORDER — CEFAZOLIN SODIUM-DEXTROSE 2-4 GM/100ML-% IV SOLN
2.0000 g | Freq: Four times a day (QID) | INTRAVENOUS | Status: AC
  Administered 2021-12-24 (×2): 2 g via INTRAVENOUS
  Filled 2021-12-24 (×2): qty 100

## 2021-12-24 MED ORDER — TRAMADOL HCL 50 MG PO TABS: 50.0000 mg | ORAL_TABLET | ORAL | Status: DC | PRN

## 2021-12-24 MED ORDER — ONDANSETRON HCL 4 MG/2ML IJ SOLN: 4.0000 mg | Freq: Four times a day (QID) | INTRAMUSCULAR | Status: DC | PRN

## 2021-12-24 MED ORDER — MIDAZOLAM HCL 2 MG/2ML IJ SOLN
INTRAMUSCULAR | Status: AC
  Filled 2021-12-24: qty 2

## 2021-12-24 MED ORDER — PHENYLEPHRINE HCL-NACL 20-0.9 MG/250ML-% IV SOLN
INTRAVENOUS | Status: DC | PRN
  Administered 2021-12-24: 50 ug/min via INTRAVENOUS

## 2021-12-24 MED ORDER — OMEGA-3-ACID ETHYL ESTERS 1 G PO CAPS
1.0000 g | ORAL_CAPSULE | Freq: Every day | ORAL | Status: DC
Start: 2021-12-24 — End: 2021-12-25
  Administered 2021-12-24 – 2021-12-25 (×2): 1 g via ORAL
  Filled 2021-12-24 (×2): qty 1

## 2021-12-24 MED ORDER — LACTATED RINGERS IV SOLN: INTRAVENOUS | Status: DC | PRN

## 2021-12-24 MED ORDER — MIDAZOLAM HCL 5 MG/5ML IJ SOLN
INTRAMUSCULAR | Status: DC | PRN
  Administered 2021-12-24: 1 mg via INTRAVENOUS

## 2021-12-24 MED ORDER — IRBESARTAN 150 MG PO TABS
300.0000 mg | ORAL_TABLET | Freq: Every day | ORAL | Status: DC
Start: 2021-12-24 — End: 2021-12-25
  Administered 2021-12-24 – 2021-12-25 (×2): 300 mg via ORAL
  Filled 2021-12-24 (×2): qty 2

## 2021-12-24 MED ORDER — ORAL CARE MOUTH RINSE: 15.0000 mL | Freq: Once | OROMUCOSAL | Status: AC

## 2021-12-24 MED ORDER — SENNOSIDES-DOCUSATE SODIUM 8.6-50 MG PO TABS
1.0000 | ORAL_TABLET | Freq: Two times a day (BID) | ORAL | Status: DC
  Administered 2021-12-24 – 2021-12-25 (×3): 1 via ORAL
  Filled 2021-12-24 (×3): qty 1

## 2021-12-24 MED ORDER — LORATADINE 10 MG PO TABS
10.0000 mg | ORAL_TABLET | ORAL | Status: DC
  Administered 2021-12-25: 10 mg via ORAL
  Filled 2021-12-24: qty 1

## 2021-12-24 MED ORDER — ONDANSETRON HCL 4 MG PO TABS
4.0000 mg | ORAL_TABLET | Freq: Four times a day (QID) | ORAL | Status: DC | PRN
Start: 2021-12-24 — End: 2021-12-25

## 2021-12-24 MED ORDER — PRONTOSAN WOUND IRRIGATION OPTIME
TOPICAL | Status: DC | PRN
Start: 2021-12-24 — End: 2021-12-24
  Administered 2021-12-24: 1 via TOPICAL

## 2021-12-24 MED ORDER — OXYCODONE HCL 5 MG PO TABS
10.0000 mg | ORAL_TABLET | ORAL | Status: DC | PRN
  Administered 2021-12-24 – 2021-12-25 (×2): 10 mg via ORAL
  Filled 2021-12-24 (×2): qty 2

## 2021-12-24 MED ORDER — ACETAMINOPHEN 10 MG/ML IV SOLN
1000.0000 mg | Freq: Four times a day (QID) | INTRAVENOUS | Status: AC
  Administered 2021-12-24 – 2021-12-25 (×3): 1000 mg via INTRAVENOUS
  Filled 2021-12-24 (×3): qty 100

## 2021-12-24 MED ORDER — PANTOPRAZOLE SODIUM 40 MG PO TBEC
40.0000 mg | DELAYED_RELEASE_TABLET | Freq: Two times a day (BID) | ORAL | Status: DC
  Administered 2021-12-24 – 2021-12-25 (×3): 40 mg via ORAL
  Filled 2021-12-24 (×3): qty 1

## 2021-12-24 MED ORDER — TRANEXAMIC ACID-NACL 1000-0.7 MG/100ML-% IV SOLN
1000.0000 mg | INTRAVENOUS | Status: AC
  Administered 2021-12-24: 1000 mg via INTRAVENOUS

## 2021-12-24 MED ORDER — NEOMYCIN-POLYMYXIN B GU 40-200000 IR SOLN
Status: DC | PRN
  Administered 2021-12-24: 14 mL

## 2021-12-24 MED ORDER — FAMOTIDINE 20 MG PO TABS
ORAL_TABLET | ORAL | Status: AC
  Administered 2021-12-24: 20 mg via ORAL
  Filled 2021-12-24: qty 1

## 2021-12-24 MED ORDER — DEXAMETHASONE SODIUM PHOSPHATE 10 MG/ML IJ SOLN: 8.0000 mg | Freq: Once | INTRAMUSCULAR | Status: AC

## 2021-12-24 MED ORDER — DIPHENHYDRAMINE HCL 12.5 MG/5ML PO ELIX: 12.5000 mg | ORAL_SOLUTION | ORAL | Status: DC | PRN

## 2021-12-24 MED ORDER — INSULIN ASPART 100 UNIT/ML IJ SOLN
0.0000 [IU] | Freq: Three times a day (TID) | INTRAMUSCULAR | Status: DC
  Administered 2021-12-24 – 2021-12-25 (×2): 3 [IU] via SUBCUTANEOUS
  Filled 2021-12-24 (×4): qty 1

## 2021-12-24 MED ORDER — DEXAMETHASONE SODIUM PHOSPHATE 10 MG/ML IJ SOLN
INTRAMUSCULAR | Status: AC
  Administered 2021-12-24: 8 mg via INTRAVENOUS
  Filled 2021-12-24: qty 1

## 2021-12-24 MED ORDER — FENTANYL CITRATE (PF) 100 MCG/2ML IJ SOLN
25.0000 ug | INTRAMUSCULAR | Status: DC | PRN
  Administered 2021-12-24: 25 ug via INTRAVENOUS

## 2021-12-24 MED ORDER — MENTHOL 3 MG MT LOZG: 1.0000 | LOZENGE | OROMUCOSAL | Status: DC | PRN

## 2021-12-24 MED ORDER — CELECOXIB 200 MG PO CAPS
ORAL_CAPSULE | ORAL | Status: AC
  Administered 2021-12-24: 400 mg via ORAL
  Filled 2021-12-24: qty 2

## 2021-12-24 MED ORDER — TRANEXAMIC ACID-NACL 1000-0.7 MG/100ML-% IV SOLN: 1000.0000 mg | Freq: Once | INTRAVENOUS | Status: AC

## 2021-12-24 MED ORDER — VITAMIN E 45 MG (100 UNIT) PO CAPS
200.0000 [IU] | ORAL_CAPSULE | Freq: Every day | ORAL | Status: DC
  Administered 2021-12-24 – 2021-12-25 (×2): 200 [IU] via ORAL
  Filled 2021-12-24 (×2): qty 2

## 2021-12-24 MED ORDER — ACETAMINOPHEN 10 MG/ML IV SOLN
INTRAVENOUS | Status: DC | PRN
  Administered 2021-12-24: 1000 mg via INTRAVENOUS

## 2021-12-24 MED ORDER — OXYMETAZOLINE HCL 0.05 % NA SOLN: 1.0000 | Freq: Four times a day (QID) | NASAL | Status: DC | PRN

## 2021-12-24 MED ORDER — PHENOL 1.4 % MT LIQD: 1.0000 | OROMUCOSAL | Status: DC | PRN

## 2021-12-24 MED ORDER — MAGNESIUM HYDROXIDE 400 MG/5ML PO SUSP
30.0000 mL | Freq: Every day | ORAL | Status: DC
  Administered 2021-12-24 – 2021-12-25 (×2): 30 mL via ORAL
  Filled 2021-12-24 (×2): qty 30

## 2021-12-24 MED ORDER — ALUM & MAG HYDROXIDE-SIMETH 200-200-20 MG/5ML PO SUSP: 30.0000 mL | ORAL | Status: DC | PRN

## 2021-12-24 SURGICAL SUPPLY — 63 items
BLADE DRUM FLTD (BLADE) ×2 IMPLANT
BLADE SAW 90X25X1.19 OSCILLAT (BLADE) ×2 IMPLANT
CARTRIDGE OIL MAESTRO DRILL (MISCELLANEOUS) ×1 IMPLANT
CUP ACETBLR 52 OD 100 SERIES (Hips) ×1 IMPLANT
DIFFUSER DRILL AIR PNEUMATIC (MISCELLANEOUS) ×2 IMPLANT
DRAPE 3/4 80X56 (DRAPES) ×2 IMPLANT
DRAPE INCISE IOBAN 66X60 STRL (DRAPES) ×2 IMPLANT
DRSG DERMACEA 8X12 NADH (GAUZE/BANDAGES/DRESSINGS) ×2 IMPLANT
DRSG MEPILEX SACRM 8.7X9.8 (GAUZE/BANDAGES/DRESSINGS) ×2 IMPLANT
DRSG OPSITE POSTOP 4X12 (GAUZE/BANDAGES/DRESSINGS) ×2 IMPLANT
DRSG OPSITE POSTOP 4X14 (GAUZE/BANDAGES/DRESSINGS) IMPLANT
DRSG TEGADERM 4X4.75 (GAUZE/BANDAGES/DRESSINGS) ×2 IMPLANT
DURAPREP 26ML APPLICATOR (WOUND CARE) ×2 IMPLANT
ELECT CAUTERY BLADE 6.4 (BLADE) ×2 IMPLANT
ELECT REM PT RETURN 9FT ADLT (ELECTROSURGICAL) ×2
ELECTRODE REM PT RTRN 9FT ADLT (ELECTROSURGICAL) ×1 IMPLANT
GAUZE 4X4 16PLY ~~LOC~~+RFID DBL (SPONGE) ×2 IMPLANT
GLOVE SURG ENC MOIS LTX SZ7.5 (GLOVE) ×4 IMPLANT
GLOVE SURG ENC TEXT LTX SZ7.5 (GLOVE) ×4 IMPLANT
GLOVE SURG UNDER LTX SZ8 (GLOVE) ×2 IMPLANT
GLOVE SURG UNDER POLY LF SZ7.5 (GLOVE) ×2 IMPLANT
GOWN STRL REUS W/ TWL LRG LVL3 (GOWN DISPOSABLE) ×2 IMPLANT
GOWN STRL REUS W/ TWL XL LVL3 (GOWN DISPOSABLE) ×1 IMPLANT
GOWN STRL REUS W/TWL LRG LVL3 (GOWN DISPOSABLE) ×2
GOWN STRL REUS W/TWL XL LVL3 (GOWN DISPOSABLE) ×1
HEAD M SROM 36MM PLUS 1.5 (Hips) IMPLANT
HEMOVAC 400CC 10FR (MISCELLANEOUS) ×2 IMPLANT
HOLDER FOLEY CATH W/STRAP (MISCELLANEOUS) ×2 IMPLANT
HOLSTER ELECTROSUGICAL PENCIL (MISCELLANEOUS) ×4 IMPLANT
IV NS IRRIG 3000ML ARTHROMATIC (IV SOLUTION) ×2 IMPLANT
KIT PEG BOARD PINK (KITS) ×2 IMPLANT
KIT TURNOVER KIT A (KITS) ×2 IMPLANT
LINER MARATHON 4MM 10DEG 36X52 (Hips) ×1 IMPLANT
MANIFOLD NEPTUNE II (INSTRUMENTS) ×4 IMPLANT
NDL SAFETY ECLIPSE 18X1.5 (NEEDLE) ×1 IMPLANT
NEEDLE HYPO 18GX1.5 SHARP (NEEDLE) ×1
NS IRRIG 500ML POUR BTL (IV SOLUTION) ×2 IMPLANT
OIL CARTRIDGE MAESTRO DRILL (MISCELLANEOUS) ×2
PACK HIP PROSTHESIS (MISCELLANEOUS) ×2 IMPLANT
PENCIL SMOKE EVACUATOR COATED (MISCELLANEOUS) ×2 IMPLANT
PIN STEIN THRED 5/32 (Pin) ×2 IMPLANT
PULSAVAC PLUS IRRIG FAN TIP (DISPOSABLE) ×2
SOL PREP PVP 2OZ (MISCELLANEOUS) ×2
SOLUTION PREP PVP 2OZ (MISCELLANEOUS) ×1 IMPLANT
SOLUTION PRONTOSAN WOUND 350ML (IRRIGATION / IRRIGATOR) ×2 IMPLANT
SPONGE DRAIN TRACH 4X4 STRL 2S (GAUZE/BANDAGES/DRESSINGS) ×2 IMPLANT
SPONGE T-LAP 18X18 ~~LOC~~+RFID (SPONGE) ×8 IMPLANT
SROM M HEAD 36MM PLUS 1.5 (Hips) ×2 IMPLANT
STAPLER SKIN PROX 35W (STAPLE) ×2 IMPLANT
STEM AML 12.0 STD 6 LRG (Hips) ×1 IMPLANT
SUT ETHIBOND #5 BRAIDED 30INL (SUTURE) ×2 IMPLANT
SUT VIC AB 0 CT1 36 (SUTURE) ×2 IMPLANT
SUT VIC AB 1 CT1 36 (SUTURE) ×4 IMPLANT
SUT VIC AB 2-0 CT1 27 (SUTURE) ×1
SUT VIC AB 2-0 CT1 TAPERPNT 27 (SUTURE) ×1 IMPLANT
SYR 20ML LL LF (SYRINGE) ×2 IMPLANT
TAPE CLOTH 3X10 WHT NS LF (GAUZE/BANDAGES/DRESSINGS) ×2 IMPLANT
TAPE TRANSPORE STRL 2 31045 (GAUZE/BANDAGES/DRESSINGS) ×2 IMPLANT
TIP FAN IRRIG PULSAVAC PLUS (DISPOSABLE) ×1 IMPLANT
TOWEL OR 17X26 4PK STRL BLUE (TOWEL DISPOSABLE) ×2 IMPLANT
TRAY FOLEY MTR SLVR 16FR STAT (SET/KITS/TRAYS/PACK) ×2 IMPLANT
TUBE KAMVAC SUCTION (TUBING) ×2 IMPLANT
WATER STERILE IRR 1000ML POUR (IV SOLUTION) ×2 IMPLANT

## 2021-12-24 NOTE — Anesthesia Procedure Notes (Signed)
Spinal ? ?Patient location during procedure: OR ?Start time: 12/24/2021 8:00 AM ?End time: 12/24/2021 8:05 AM ?Reason for block: surgical anesthesia ?Staffing ?Performed: resident/CRNA  ?Anesthesiologist: Yevette Edwards, MD ?Resident/CRNA: Berniece Pap, CRNA ?Preanesthetic Checklist ?Completed: patient identified, IV checked, site marked, risks and benefits discussed, surgical consent, monitors and equipment checked, pre-op evaluation and timeout performed ?Spinal Block ?Patient position: sitting ?Prep: DuraPrep ?Patient monitoring: heart rate, cardiac monitor, continuous pulse ox and blood pressure ?Approach: midline ?Location: L3-4 ?Injection technique: single-shot ?Needle ?Needle type: Sprotte  ?Needle gauge: 24 G ?Needle length: 9 cm ?Assessment ?Sensory level: T4 ?Events: CSF return ? ? ? ?

## 2021-12-24 NOTE — TOC Progression Note (Addendum)
Transition of Care (TOC) - Progression Note  ? ? ?Patient Details  ?Name: Tina Best ?MRN: [id number] ?Date of Birth: 1945/11/28 ? ?Transition of Care (TOC) CM/SW Contact  ?Karrin Eisenmenger A Shannen [place], LCSW ?Phone Number: ?12/24/2021, 2:47 PM ? ?Clinical Narrative:  Per [facility] list pt has been prearranged by Surgeons office to have [place] at d/c.   ?BSC and Walker ordered through [employer]. Pt confirmed she needed both. ? ? ?  ?  ? ?Expected Discharge Plan and Services ?  ?  ?  ?  ?  ?                ?  ?  ?  ?  ?  ?  ?  ?  ?  ?  ? ? ?Social Determinants of Health (SDOH) Interventions ?  ? ?Readmission Risk Interventions ?   ? View : No data to display.  ?  ?  ?  ? ? ?

## 2021-12-24 NOTE — Plan of Care (Signed)

## 2021-12-24 NOTE — Op Note (Signed)
OPERATIVE NOTE ? ?DATE OF SURGERY:  12/24/2021 ? ?PATIENT NAME:  Tina Best   ?DOB: 10-29-1945  ?MRN: [id number] ? ?PRE-OPERATIVE DIAGNOSIS: Degenerative arthrosis of the left hip, primary ? ?POST-OPERATIVE DIAGNOSIS:  Same ? ?PROCEDURE:  Left total hip arthroplasty ? ?SURGEON:  Marciano Sequin. M.D. ? ?ASSISTANT: Cassell Smiles, PA-C (present and scrubbed throughout the case, critical for assistance with exposure, retraction, instrumentation, and closure) ? ?ANESTHESIA: spinal ? ?ESTIMATED BLOOD LOSS: 100 mL ? ?FLUIDS REPLACED: 1100 mL of crystalloid ? ?DRAINS: 2 medium Hemovac drains ? ?IMPLANTS UTILIZED: DePuy 12 mm large stature AML femoral stem, 52 mm OD Pinnacle 100 acetabular component, +4 mm 10 degree Pinnacle Marathon polyethylene insert, and a 36 mm M-SPEC +1.5 mm hip ball ? ?INDICATIONS FOR SURGERY: Tina Best is a 76 y.o. year old female with a long history of progressive hip and groin  pain. X-rays demonstrated severe degenerative changes. The patient had not seen any significant improvement despite conservative nonsurgical intervention. After discussion of the risks and benefits of surgical intervention, the patient expressed understanding of the risks benefits and agree with plans for total hip arthroplasty.  ? ?The risks, benefits, and alternatives were discussed at length including but not limited to the risks of infection, bleeding, nerve injury, stiffness, blood clots, the need for revision surgery, limb length inequality, dislocation, cardiopulmonary complications, among others, and they were willing to proceed. ? ?PROCEDURE IN DETAIL: The patient was brought into the operating room and, after adequate spinal anesthesia was achieved, the patient was placed in a right lateral decubitus position. Axillary roll was placed and all bony prominences were well-padded. The patient's left hip was cleaned and prepped with alcohol and DuraPrep and draped in the usual sterile fashion. A  "timeout" was performed as per usual protocol. A lateral curvilinear incision was made gently curving towards the posterior superior iliac spine. The IT band was incised in line with the skin incision and the fibers of the gluteus maximus were split in line. The piriformis tendon was identified, skeletonized, and incised at its insertion to the proximal femur and reflected posteriorly. A T type posterior capsulotomy was performed. Prior to dislocation of the femoral head, a threaded Steinmann pin was inserted through a separate stab incision into the pelvis superior to the acetabulum and bent in the form of a stylus so as to assess limb length and hip offset throughout the procedure. The femoral head was then dislocated posteriorly. Inspection of the femoral head demonstrated severe degenerative changes with full-thickness loss of articular cartilage. The femoral neck cut was performed using an oscillating saw. The anterior capsule was elevated off of the femoral neck using a periosteal elevator. Attention was then directed to the acetabulum. The remnant of the labrum was excised using electrocautery. Inspection of the acetabulum also demonstrated significant degenerative changes. The acetabulum was reamed in sequential fashion up to a 51 mm diameter. Good punctate bleeding bone was encountered. A 52 mm Pinnacle 100 acetabular component was positioned and impacted into place. Good scratch fit was appreciated. A neutral polyethylene trial was inserted. ? ?Attention was then directed to the proximal femur. A hole for reaming of the proximal femoral canal was created using a high-speed burr. The femoral canal was reamed in sequential fashion up to a 11.5 mm diameter. This allowed for approximately 6 cm of scratch fit. Serial broaches were inserted up to a 12 mm large stature femoral broach. Calcar region was planed and a trial reduction was performed using  a 36 mm hip ball with a +1.5 mm neck length.  Reasonably good  stability was noted but it was elected to trial a +4 mm 10 degree trial with the high side positioned at the 4 o'clock position.  Good equalization of limb lengths and hip offset was appreciated and excellent stability was noted both anteriorly and posteriorly. Trial components were removed. The acetabular shell was irrigated with copious amounts of normal saline with antibiotic solution and suctioned dry. A +4 mm 10 degree Pinnacle Marathon polyethylene insert was positioned with the high side at the 4 o'clock position and impacted into place. Next, a 12 mm large stature AML femoral stem was positioned and impacted into place. Excellent scratch fit was appreciated. A trial reduction was again performed with a 36 mm hip ball with a +1.5 mm neck length. Again, good equalization of limb lengths was appreciated and excellent stability appreciated both anteriorly and posteriorly. The hip was then dislocated and the trial hip ball was removed. The Morse taper was cleaned and dried. A 36 mm M-SPEC hip ball with a +1.5 mm neck length was placed on the trunnion and impacted into place. The hip was then reduced and placed through range of motion. Excellent stability was appreciated both anteriorly and posteriorly. ? ?The wound was irrigated with copious amounts of normal saline followed by 350 ml of Prontosan and suctioned dry. Good hemostasis was appreciated. The posterior capsulotomy was repaired using #5 Ethibond. Piriformis tendon was reapproximated to the undersurface of the gluteus medius tendon using #5 Ethibond. The IT band was reapproximated using interrupted sutures of #1 Vicryl. Subcutaneous tissue was approximated using first #0 Vicryl followed by #2-0 Vicryl. The skin was closed with skin staples. ? ?The patient tolerated the procedure well and was transported to the recovery room in stable condition.  ? ?Marciano Sequin., M.D.  ?

## 2021-12-24 NOTE — Evaluation (Signed)
Physical Therapy Evaluation ?Patient Details ?Name: Tina Best ?MRN: [id number] ?DOB: 13-Aug-1946 ?Today's Date: 12/24/2021 ? ?History of Present Illness ? Pt is a 76 yo F diagnosed with degenerative arthrosis of the left hip and is s/p elective L THA.  PMH includes DM and HTN. ?  ?Clinical Impression ? Pt was pleasant and motivated to participate during the session and put forth good effort throughout. Pt required no physical assistance during the session but did require extra time, effort, and cuing for hip precaution compliance with functional tasks. Pt reported dizziness once coming to sitting that gradually eased.  Seated BP taken at 116/77. BP then taken in standing at 123/74, SpO2 and HR WNL throughout, nursing present and aware.  Pt was steady taking steps at the EOB and from bed to chair. Pt expected to make good progress while in acute care and will benefit from HHPT upon discharge to safely address deficits listed in patient problem list for decreased caregiver assistance and eventual return to PLOF. ? ? ?   ?   ? ?Recommendations for follow up therapy are one component of a multi-disciplinary discharge planning process, led by the attending physician.  Recommendations may be updated based on patient status, additional functional criteria and insurance authorization. ? ?Follow Up Recommendations Home health PT ? ?  ?Assistance Recommended at Discharge Intermittent Supervision/Assistance  ?Patient can return home with the following ? A little help with walking and/or transfers;A little help with bathing/dressing/bathroom;Assistance with cooking/housework;Assist for transportation;Help with stairs or ramp for entrance ? ?  ?Equipment Recommendations Rolling walker (2 wheels);BSC/3in1  ?Recommendations for Other Services ?    ?  ?Functional Status Assessment Patient has had a recent decline in their functional status and demonstrates the ability to make significant improvements in function in a  reasonable and predictable amount of time.  ? ?  ?Precautions / Restrictions Precautions ?Precautions: Fall;Posterior Hip ?Precaution Booklet Issued: Yes (comment) ?Restrictions ?Weight Bearing Restrictions: Yes ?LLE Weight Bearing: Weight bearing as tolerated  ? ?  ? ?Mobility ? Bed Mobility ?Overal bed mobility: Modified Independent ?  ?  ?  ?  ?  ?  ?General bed mobility comments: Extra time and effort only as well as use of grab bar, no physical assistance needed ?  ? ?Transfers ?Overall transfer level: Needs assistance ?Equipment used: Rolling walker (2 wheels) ?Transfers: Sit to/from Stand ?Sit to Stand: From elevated surface, Min guard ?  ?  ?  ?  ?  ?General transfer comment: Min verbal and visual cues for sequencing for hip precaution compliance ?  ? ?Ambulation/Gait ?Ambulation/Gait assistance: Min guard ?Gait Distance (Feet): 3 Feet ?Assistive device: Rolling walker (2 wheels) ?Gait Pattern/deviations: Step-to pattern, Decreased step length - left, Antalgic ?Gait velocity: decreased ?  ?  ?General Gait Details: Mod verbal and visual cues for sequencing during 90 deg L turn to chair to prevent CKC L hip IR; pt steady without LOB or buckling during amb at EOB and from bed to chair ? ?Stairs ?  ?  ?  ?  ?  ? ?Wheelchair Mobility ?  ? ?Modified Rankin (Stroke Patients Only) ?  ? ?  ? ?Balance Overall balance assessment: Needs assistance ?Sitting-balance support: Feet unsupported, No upper extremity supported ?Sitting balance-Leahy Scale: Good ?  ?  ?Standing balance support: Bilateral upper extremity supported, During functional activity ?Standing balance-Leahy Scale: Fair ?  ?  ?  ?  ?  ?  ?  ?  ?  ?  ?  ?  ?   ? ? ? ?  Pertinent Vitals/Pain Pain Assessment ?Pain Assessment: 0-10 ?Pain Score: 3  ?Pain Descriptors / Indicators: Sore ?Pain Intervention(s): Repositioned, Ice applied, Premedicated before session, Monitored during session  ? ? ?Home Living Family/patient expects to be discharged to:: Private  residence ?Living Arrangements: Spouse/significant other ?Available Help at Discharge: Family;Available 24 hours/day ?Type of Home: House ?Home Access: Stairs to enter ?Entrance Stairs-Rails: Left ?Entrance Stairs-Number of Steps: 6 ?  ?Home Layout: One level ?Home Equipment: Grab bars - tub/shower;Cane - single point ?   ?  ?Prior Function   ?  ?  ?  ?  ?  ?  ?Mobility Comments: Mod Ind amb community distances with a SPC, no fall history ?ADLs Comments: Ind with ADLs ?  ? ? ?Hand Dominance  ?   ? ?  ?Extremity/Trunk Assessment  ? Upper Extremity Assessment ?Upper Extremity Assessment: Overall WFL for tasks assessed ?  ? ?Lower Extremity Assessment ?Lower Extremity Assessment: Generalized weakness;LLE deficits/detail ?LLE Deficits / Details: BLE ankle AROM, sensation to light touch, and strength grossly WNL ?LLE: Unable to fully assess due to pain ?LLE Sensation: WNL ?  ? ?   ?Communication  ? Communication: No difficulties  ?Cognition Arousal/Alertness: Awake/alert ?Behavior During Therapy: [facility] for tasks assessed/performed ?Overall Cognitive Status: Within Functional Limits for tasks assessed ?  ?  ?  ?  ?  ?  ?  ?  ?  ?  ?  ?  ?  ?  ?  ?  ?  ?  ?  ? ?  ?General Comments   ? ?  ?Exercises Total Joint Exercises ?Ankle Circles/Pumps: AROM, Strengthening, Both, 10 reps ?Quad Sets: Strengthening, Both, 10 reps ?Gluteal Sets: Strengthening, Both, 10 reps ?Long Arc Quad: AROM, Strengthening, Both, 10 reps ?Marching in Standing: Strengthening, Both, 5 reps, Standing ?Other Exercises ?Other Exercises: Posterior hip precaution education via handout, verbally, and during functional tasks ?Other Exercises: HEP education per handout  ? ?Assessment/Plan  ?  ?PT Assessment Patient needs continued PT services  ?PT Problem List Decreased strength;Decreased activity tolerance;Decreased balance;Decreased mobility;Decreased knowledge of use of DME;Decreased knowledge of precautions;Pain ? ?   ?  ?PT Treatment Interventions DME  instruction;Gait training;Stair training;Functional mobility training;Therapeutic activities;Therapeutic exercise;Balance training;Patient/family education   ? ?PT Goals (Current goals can be found in the Care Plan section)  ?Acute Rehab PT Goals ?Patient Stated Goal: To be able to work in the yard ?PT Goal Formulation: With patient ?Time For Goal Achievement: 01/06/22 ?Potential to Achieve Goals: Good ? ?  ?Frequency BID ?  ? ? ?Co-evaluation   ?  ?  ?  ?  ? ? ?  ?AM-PAC PT "6 Clicks" Mobility  ?Outcome Measure Help needed turning from your back to your side while in a flat bed without using bedrails?: A Little ?Help needed moving from lying on your back to sitting on the side of a flat bed without using bedrails?: A Little ?Help needed moving to and from a bed to a chair (including a wheelchair)?: A Little ?Help needed standing up from a chair using your arms (e.g., wheelchair or bedside chair)?: A Little ?Help needed to walk in hospital room?: A Little ?Help needed climbing 3-5 steps with a railing? : A Lot ?6 Click Score: 17 ? ?  ?End of Session Equipment Utilized During Treatment: Gait belt ?Activity Tolerance: Patient tolerated treatment well ?Patient left: in chair;with call bell/phone within reach;with chair alarm set;with SCD's reapplied;with nursing/sitter in room ?Nurse Communication: Mobility status;Weight bearing status ?PT Visit Diagnosis: Other  abnormalities of gait and mobility (R26.89);Muscle weakness (generalized) (M62.81);Pain ?Pain - Right/Left: Left ?Pain - part of body: Hip ?  ? ?Time: 1350-1436 ?PT Time Calculation (min) (ACUTE ONLY): 46 min ? ? ?Charges:   PT Evaluation ?$PT Eval Moderate Complexity: 1 Mod ?PT Treatments ?$Therapeutic Exercise: 8-22 mins ?$Therapeutic Activity: 8-22 mins ?  ?   ? ?D. Royetta Asal PT, DPT ?12/24/21, 3:30 PM ? ? ? ?

## 2021-12-24 NOTE — Transfer of Care (Signed)
Immediate Anesthesia Transfer of Care Note ? ?Patient: Tina Best ? ?Procedure(s) Performed: TOTAL HIP ARTHROPLASTY (Left: Hip) ? ?Patient Location: PACU ? ?Anesthesia Type:General and Spinal ? ?Level of Consciousness: drowsy ? ?Airway & Oxygen Therapy: Patient Spontanous Breathing and Patient connected to nasal cannula oxygen ? ?Post-op Assessment: Report given to RN and Post -op Vital signs reviewed and stable ? ?Post vital signs: Reviewed and stable ? ?Last Vitals:  ?Vitals Value Taken Time  ?BP 112/56 12/24/21 1119  ?Temp    ?Pulse 88 12/24/21 1122  ?Resp 20 12/24/21 1122  ?SpO2 98 % 12/24/21 1122  ?Vitals shown include unvalidated device data. ? ?Last Pain:  ?Vitals:  ? 12/24/21 0613  ?TempSrc: Oral  ?PainSc: 5   ?   ? ?  ? ?Complications: No notable events documented. ?

## 2021-12-24 NOTE — H&P (Signed)
The patient has been re-examined, and the chart reviewed, and there have been no interval changes to the documented history and physical.    The risks, benefits, and alternatives have been discussed at length. The patient expressed understanding of the risks benefits and agreed with plans for surgical intervention.  Nickolette Espinola P. Avonell Lenig, Jr. M.D.    

## 2021-12-24 NOTE — Anesthesia Preprocedure Evaluation (Signed)
Anesthesia Evaluation  ?Patient identified by MRN, date of birth, ID band ?Patient awake ? ? ? ?Reviewed: ?Allergy & Precautions, H&P , NPO status , Patient's Chart, lab work & pertinent test results, reviewed documented beta blocker date and time  ? ?History of Anesthesia Complications ?(+) DIFFICULT AIRWAY and history of anesthetic complications ? ?Airway ?Mallampati: III ? ? ?Neck ROM: full ? ? ? Dental ? ?(+) Poor Dentition ?  ?Pulmonary ?asthma ,  ?  ?Pulmonary exam normal ? ? ? ? ? ? ? Cardiovascular ?Exercise Tolerance: Poor ?hypertension, On Medications ?Normal cardiovascular exam+ Valvular Problems/Murmurs  ?Rhythm:regular Rate:Normal ? ? ?  ?Neuro/Psych ? Neuromuscular disease negative psych ROS  ? GI/Hepatic ?Neg liver ROS, hiatal hernia, GERD  Medicated,  ?Endo/Other  ?diabetes, Well Controlled, Type 2, Oral Hypoglycemic AgentsHypothyroidism  ? Renal/GU ?negative Renal ROS  ?negative genitourinary ?  ?Musculoskeletal ? ? Abdominal ?  ?Peds ? Hematology ?negative hematology ROS ?(+)   ?Anesthesia Other Findings ?Past Medical History: ?No date: Arthritis ?No date: Asthma ?    Comment:  as a child ?No date: Diabetes mellitus without complication (HCC) ?No date: Difficult intubation ?    Comment:  difficult airway for parathyroid surgery ?No date: GERD (gastroesophageal reflux disease) ?No date: H/O Hashimoto thyroiditis ?No date: Heart murmur ?    Comment:  was told this years ago ?No date: History of hiatal hernia ?No date: Hyperlipidemia ?No date: Hypertension ?No date: Hypothyroidism ?Past Surgical History: ?No date: CATARACT EXTRACTION; Bilateral ?2009: PARATHYROIDECTOMY ?1967: TONSILLECTOMY ?2010: TRIGGER FINGER RELEASE; Bilateral ?BMI   ? Body Mass Index: 28.73 kg/m?  ?  ? Reproductive/Obstetrics ?negative OB ROS ? ?  ? ? ? ? ? ? ? ? ? ? ? ? ? ?  ?  ? ? ? ? ? ? ? ? ?Anesthesia Physical ?Anesthesia Plan ? ?ASA: 3 ? ?Anesthesia Plan: Spinal  ? ?Post-op Pain Management:    ? ?Induction:  ? ?PONV Risk Score and Plan: 3 ? ?Airway Management Planned:  ? ?Additional Equipment:  ? ?Intra-op Plan:  ? ?Post-operative Plan:  ? ?Informed Consent: I have reviewed the patients History and Physical, chart, labs and discussed the procedure including the risks, benefits and alternatives for the proposed anesthesia with the patient or authorized representative who has indicated his/her understanding and acceptance.  ? ? ? ?Dental Advisory Given ? ?Plan Discussed with: CRNA ? ?Anesthesia Plan Comments:   ? ? ? ? ? ? ?Anesthesia Quick Evaluation ? ?

## 2021-12-24 NOTE — Progress Notes (Signed)
?   12/24/21 0700  ?Clinical Encounter Type  ?Visited With Patient and family together  ?Visit Type Initial;Pre-op  ?Spiritual Encounters  ?Spiritual Needs Prayer  ? ?Chaplain supported patient and family through compassionate presence, conversation and prayer. ?

## 2021-12-25 ENCOUNTER — Encounter: Payer: Self-pay | Admitting: Orthopedic Surgery

## 2021-12-25 DIAGNOSIS — M1612 Unilateral primary osteoarthritis, left hip: Secondary | ICD-10-CM | POA: Diagnosis not present

## 2021-12-25 LAB — GLUCOSE, CAPILLARY
Glucose-Capillary: 140 mg/dL — ABNORMAL HIGH (ref 70–99)
Glucose-Capillary: 167 mg/dL — ABNORMAL HIGH (ref 70–99)

## 2021-12-25 MED ORDER — ENOXAPARIN SODIUM 40 MG/0.4ML IJ SOSY: 40.0000 mg | PREFILLED_SYRINGE | INTRAMUSCULAR | 0 refills | Status: AC

## 2021-12-25 MED ORDER — TRAMADOL HCL 50 MG PO TABS: 50.0000 mg | ORAL_TABLET | ORAL | 0 refills | Status: AC | PRN

## 2021-12-25 MED ORDER — OXYCODONE HCL 5 MG PO TABS: 5.0000 mg | ORAL_TABLET | ORAL | 0 refills | Status: AC | PRN

## 2021-12-25 MED ORDER — CELECOXIB 200 MG PO CAPS: 200.0000 mg | ORAL_CAPSULE | Freq: Two times a day (BID) | ORAL | 0 refills | Status: AC

## 2021-12-25 NOTE — Progress Notes (Signed)
Initial Nutrition Assessment ? ?DOCUMENTATION CODES:  ? ?Not applicable ? ?INTERVENTION:  ?- Encourage adequate PO intake ?- Ensure Max po BID, each supplement provides 150 kcal and 30 grams of protein.  ?- Continue MVI with minerals daily ? ?NUTRITION DIAGNOSIS:  ? ?Increased nutrient needs related to post-op healing as evidenced by estimated needs. ? ?GOAL:  ? ?Patient will meet greater than or equal to 90% of their needs ? ?MONITOR:  ? ?PO intake, Supplement acceptance, Labs, Weight trends ? ?REASON FOR ASSESSMENT:  ? ?Malnutrition Screening Tool ?  ? ?ASSESSMENT:  ? ?Pt admitted for preoperative history and evaluation for planned L total hip arthroplasty. PMH significant for GERD, hypertension, hashimoto thyroiditis, hyperparathyroidism, HLD, and  T2DM. ? ?3/27: s/p L total hip arthroplasty ? ?Pt sitting up in chair during visit. She states that she is being d/c today. Her appetite/PO intake has decreased from baseline d/t pain in her hip which she is hopeful will improve now that she has had hip repair. Recently at home, she has been eating a donut for breakfast, a salad for lunch and hot meal for dinner. Noted meal completions during admission of 80% x 3 recorded meals. ? ?Briefly discussed importance of adequate nutrition and protein for post-op healing. ? ?Pt states that her usual weight is 172 lbs and denies recent weight loss. Endorses slight weight gain to 178 lbs. Unfortunately, there is limited documentation of weight history to confirm this. Current weight noted to be 178 lbs. ? ?Medications: Vitamin D3, ferrous sulfate, SSI, magnesium hydroxide, metformin, reglan, MVI, lovaza, protonix, senna, Vitamin D ? ?Labs reviewed. ? ?NUTRITION - FOCUSED PHYSICAL EXAM: ?Deferred to follow up ? ?Diet Order:   ?Diet Order   ? ?       ?  Diet Carb Modified Fluid consistency: Thin; Room service appropriate? Yes  Diet effective now       ?  ? ?  ?  ? ?  ? ? ?EDUCATION NEEDS:  ? ?Education needs have been  addressed ? ?Skin:  Skin Assessment: Skin Integrity Issues: ?Skin Integrity Issues:: Incisions ?Incisions: L hip (closed) ? ?Last BM:  3/26 ? ?Height:  ? ?Ht Readings from Last 1 Encounters:  ?12/24/21 5\' 6"  (1.676 m)  ? ? ?Weight:  ? ?Wt Readings from Last 1 Encounters:  ?12/24/21 80.7 kg  ? ?BMI:  Body mass index is 28.73 kg/m?. ? ?Estimated Nutritional Needs:  ? ?Kcal:  1900-2100 ? ?Protein:  95-110g ? ?Fluid:  >/=1.9L ? ?Clayborne Dana, RDN, LDN ?Clinical Nutrition ?

## 2021-12-25 NOTE — Plan of Care (Signed)
Patient discharged home per MD orders at this time.All discharge instructions,education and medications reviewed with the patient.Pt expressed understanding and will comply with dc instructions.follow up appointments was also communicated to the Pt.no verbal c/o or any ssx of distress.Pt was discharged home with HH/PT services per order.patient was transported home by spouse in a privately home vehicle. ?

## 2021-12-25 NOTE — Progress Notes (Signed)
?  Subjective: ?1 Day Post-Op Procedure(s) (LRB): ?TOTAL HIP ARTHROPLASTY (Left) ?Patient reports pain as moderate.   ?Patient is well, and has had no acute complaints or problems ?Plan is to go Home after hospital stay. ?Negative for chest pain and shortness of breath ?Fever: no ?Gastrointestinal: negative for nausea and vomiting.  Patient has not had a bowel movement. ? ?Objective: ?Vital signs in last 24 hours: ?Temp:  [97 ?F (36.1 ?C)-98.7 ?F (37.1 ?C)] 98.7 ?F (37.1 ?C) (03/28 0753) ?Pulse Rate:  [69-101] 101 (03/28 0753) ?Resp:  [11-18] 16 (03/28 0447) ?BP: (99-125)/(54-70) 123/70 (03/28 0753) ?SpO2:  [95 %-99 %] 97 % (03/28 0753) ? ?Intake/Output from previous day: ? ?Intake/Output Summary (Last 24 hours) at 12/25/2021 0810 ?Last data filed at 12/25/2021 0536 ?Gross per 24 hour  ?Intake 2450 ml  ?Output 745 ml  ?Net 1705 ml  ?  ?Intake/Output this shift: ?No intake/output data recorded. ? ?Labs: ?No results for input(s): HGB in the last 72 hours. ?No results for input(s): WBC, RBC, HCT, PLT in the last 72 hours. ?No results for input(s): NA, K, CL, CO2, BUN, CREATININE, GLUCOSE, CALCIUM in the last 72 hours. ?No results for input(s): LABPT, INR in the last 72 hours. ? ? ?EXAM ?General - Patient is Alert, Appropriate, and Oriented ?Extremity - Neurovascular intact ?Dorsiflexion/Plantar flexion intact ?Compartment soft ?Dressing/Incision -clean, dry, no drainage, Hemovac in place.  ?Motor Function - intact, moving foot and toes well on exam. Able to perform independent SLR.  ?Cardiovascular- Regular rate and rhythm, no murmurs/rubs/gallops ?Respiratory- Lungs clear to auscultation bilaterally ?Gastrointestinal- soft, nontender, and active bowel sounds ? ? ?Assessment/Plan: ?1 Day Post-Op Procedure(s) (LRB): ?TOTAL HIP ARTHROPLASTY (Left) ?Principal Problem: ?  Hx of total hip arthroplasty, left ? ?Estimated body mass index is 28.73 kg/m? as calculated from the following: ?  Height as of this encounter: 5\' 6"   (1.676 m). ?  Weight as of this encounter: 80.7 kg. ?Advance diet ?Up with therapy ? ?Anticipate d/c this PM. ? ?Hemovac removed. and Mini compression dressing applied.  ? ?DVT Prophylaxis - Lovenox, Ted hose, and SCDs ?Weight-Bearing as tolerated to left leg ? ? , PA-C ?[facility] ?12/25/2021, 8:10 AM ? ?

## 2021-12-25 NOTE — Discharge Summary (Signed)
Physician Discharge Summary  ?Patient ID: ?Tina Best ?MRN: [id number] ?DOB/AGE: 78/11/1945 78 y.o. ? ?Admit date: 12/24/2021 ?Discharge date: 12/25/2021 ? ?Admission Diagnoses:  ?Hx of total hip arthroplasty, left [Z96.642] ? ?Surgeries:Procedure(s): ?Left total hip arthroplasty ?  ?SURGEON:  Jena Gauss. M.D. ?  ?ASSISTANT: Baldwin Jamaica, PA-C (present and scrubbed throughout the case, critical for assistance with exposure, retraction, instrumentation, and closure) ?  ?ANESTHESIA: spinal ?  ?ESTIMATED BLOOD LOSS: 100 mL ?  ?FLUIDS REPLACED: 1100 mL of crystalloid ?  ?DRAINS: 2 medium Hemovac drains ?  ?IMPLANTS UTILIZED: DePuy 12 mm large stature AML femoral stem, 52 mm OD Pinnacle 100 acetabular component, +4 mm 10 degree Pinnacle Marathon polyethylene insert, and a 36 mm M-SPEC +1.5 mm hip ball ? ?Discharge Diagnoses: ?Patient Active Problem List  ? Diagnosis Date Noted  ? Hx of total hip arthroplasty, left 12/24/2021  ? Cataract cortical, senile 12/23/2021  ? Primary osteoarthritis of left hip 09/30/2021  ? Carpal tunnel syndrome on both sides 06/03/2018  ? Monocular esotropia of right eye 12/31/2017  ? Family history of breast cancer in mother 11/29/2017  ? High risk medication use 07/29/2016  ? Hypertension associated with type 2 diabetes mellitus (HCC) 05/28/2016  ? Rhinitis medicamentosa 05/20/2014  ? Hyperlipidemia associated with type 2 diabetes mellitus (HCC) 05/19/2014  ? Hypothyroidism (acquired) 05/19/2014  ? Perennial allergic rhinitis with seasonal variation 05/19/2014  ? Type 2 diabetes mellitus with other specified complication (HCC) 05/19/2014  ? Vitamin D deficiency 05/19/2014  ? ? ?Past Medical History:  ?Diagnosis Date  ? Arthritis   ? Asthma   ? as a child  ? Diabetes mellitus without complication (HCC)   ? Difficult intubation   ? difficult airway for parathyroid surgery  ? GERD (gastroesophageal reflux disease)   ? H/O Hashimoto thyroiditis   ? Heart murmur   ? was told this  years ago  ? History of hiatal hernia   ? Hyperlipidemia   ? Hypertension   ? Hypothyroidism   ? ?  ?Transfusion:  ?  ?Consultants (if any):  ? ?Discharged Condition: Improved ? ?Hospital Course: Tina Best is an 76 y.o. female who was admitted 12/24/2021 with a diagnosis of left hip osteoarthritis and went to the operating room on 12/24/2021 and underwent left total hip arthroplasty through posterior approach. The patient received perioperative antibiotics for prophylaxis (see below). The patient tolerated the procedure well and was transported to PACU in stable condition. After meeting PACU criteria, the patient was subsequently transferred to the Orthopaedics/Rehabilitation unit.  ? ?The patient received DVT prophylaxis in the form of early mobilization, Lovenox, TED hose, and SCDs . A sacral pad had been placed and heels were elevated off of the bed with rolled towels in order to protect skin integrity. Foley catheter was discontinued on postoperative day #0. Wound drains were discontinued on postoperative day #1. The surgical incision was healing well without signs of infection. ? ?Physical therapy was initiated postoperatively for transfers, gait training, and strengthening. Occupational therapy was initiated for activities of daily living and evaluation for assisted devices. Rehabilitation goals were reviewed in detail with the patient. The patient made steady progress with physical therapy and physical therapy recommended discharge to Home.  ? ?The patient achieved the preliminary goals of this hospitalization and was felt to be medically and orthopaedically appropriate for discharge. ? ?She was given perioperative antibiotics:  ?Anti-infectives (From admission, onward)  ? ? Start     Dose/Rate Route Frequency Ordered  Stop  ? 12/24/21 1305  ceFAZolin (ANCEF) IVPB 2g/100 mL premix       ? 2 g ?200 mL/hr over 30 Minutes Intravenous Every 6 hours 12/24/21 1306 12/24/21 2311  ? 12/24/21 0618   ceFAZolin (ANCEF) 2-4 GM/100ML-% IVPB       ?Note to Pharmacy: Stefano Gaul H: cabinet override  ?    12/24/21 0618 12/24/21 0824  ? 12/24/21 0600  ceFAZolin (ANCEF) IVPB 2g/100 mL premix       ? 2 g ?200 mL/hr over 30 Minutes Intravenous On call to O.R. 12/24/21 0043 12/24/21 0806  ? ?  ?. ? ?Recent vital signs:  ?Vitals:  ? 12/25/21 0753 12/25/21 1141  ?BP: 123/70 (!) 103/49  ?Pulse: (!) 101 91  ?Resp:    ?Temp: 98.7 ?F (37.1 ?C) 98.2 ?F (36.8 ?C)  ?SpO2: 97% 96%  ? ? ?Recent laboratory studies:  ?No results for input(s): WBC, HGB, HCT, PLT, K, CL, CO2, BUN, CREATININE, GLUCOSE, CALCIUM, LABPT, INR in the last 72 hours. ? ?Diagnostic Studies: DG HIP PORT UNILAT WITH PELVIS 1V LEFT ? ?Result Date: 12/24/2021 ?CLINICAL DATA:  Left hip arthroplasty EXAM: DG HIP (WITH OR WITHOUT PELVIS) 1V PORT LEFT COMPARISON:  12/24/2021 FINDINGS: Frontal view of the lower pelvis and bilateral hips was performed. Left hip arthroplasty is identified in the expected position. No evidence of acute fracture. Specifically, the lucency overlying the lesser trochanter on prior study is no longer visualized. Surgical drain overlies the left hip. Postsurgical changes in the overlying soft tissues. IMPRESSION: 1. Unremarkable left hip arthroplasty. 2. The lucency seen within the lesser trochanter on prior study is no longer identified, and may have reflected artifact on the prior study. Electronically Signed   By: Sharlet Salina M.D.   On: 12/24/2021 17:54  ? ?DG Hip Port Unilat With Pelvis 1V Left ? ?Result Date: 12/24/2021 ?CLINICAL DATA:  Post LEFT hip replacement EXAM: DG HIP (WITH OR WITHOUT PELVIS) 1V PORT LEFT COMPARISON:  Portable exam 1126 hours without priors for comparison FINDINGS: LEFT hip prosthesis identified. Lateral margin of the proximal LEFT femur is excluded on the AP view. Questionable nondisplaced fracture at the lesser trochanter versus superimposed artifact. No additional osseous abnormality seen. Surgical drain  overlies the operative site. IMPRESSION: Incomplete visualization of proximal LEFT femur. LEFT hip prosthesis with question nondisplaced fracture of the lesser trochanter versus superimposed artifact; follow-up/repeat LEFT hip radiographs recommended. Findings called to [facility] RN on 1A on 12/24/2021 at 1228 hours. Electronically Signed   By: Ulyses Southward M.D.   On: 12/24/2021 12:28   ? ?Discharge Medications:   ?Allergies as of 12/25/2021   ? ?   Reactions  ? Brethine [terbutaline]   ? Heart racing   ? Lisinopril Cough  ? ?  ? ?  ?Medication List  ?  ? ?STOP taking these medications   ? ?aspirin EC 81 MG tablet ?  ?ibuprofen 200 MG tablet ?Commonly known as: ADVIL ?  ? ?  ? ?TAKE these medications   ? ?CALTRATE 600+D PO ?Take 1 tablet by mouth daily. ?  ?celecoxib 200 MG capsule ?Commonly known as: CELEBREX ?Take 1 capsule (200 mg total) by mouth 2 (two) times daily. ?  ?cholecalciferol 25 MCG (1000 UNIT) tablet ?Commonly known as: VITAMIN D3 ?Take 1,000 Units by mouth 2 (two) times daily. ?  ?enoxaparin 40 MG/0.4ML injection ?Commonly known as: LOVENOX ?Inject 0.4 mLs (40 mg total) into the skin daily for 14 days. ?  ?Fish Oil 1000  MG Caps ?Take 1,000 mg by mouth daily. ?  ?hydrochlorothiazide 25 MG tablet ?Commonly known as: HYDRODIURIL ?Take 25 mg by mouth every morning. ?  ?levothyroxine 112 MCG tablet ?Commonly known as: SYNTHROID ?Take 112 mcg by mouth every morning. ?  ?loratadine 10 MG tablet ?Commonly known as: CLARITIN ?Take 10 mg by mouth every morning. ?  ?Lysine 500 MG Tabs ?Take 500 mg by mouth every other day. ?  ?metFORMIN 500 MG 24 hr tablet ?Commonly known as: GLUCOPHAGE-XR ?Take 1,000 mg by mouth 2 (two) times daily. ?  ?multivitamin with minerals Tabs tablet ?Take 1 tablet by mouth daily. ?  ?oxyCODONE 5 MG immediate release tablet ?Commonly known as: Oxy IR/ROXICODONE ?Take 1 tablet (5 mg total) by mouth every 4 (four) hours as needed for severe pain. ?  ?oxymetazoline 0.05 % nasal spray ?Commonly  known as: AFRIN ?Place 1 spray into both nostrils 4 (four) times daily as needed for congestion. ?  ?simvastatin 20 MG tablet ?Commonly known as: ZOCOR ?Take 20 mg by mouth daily with supper. ?  ?telmisartan 80 MG

## 2021-12-25 NOTE — Anesthesia Postprocedure Evaluation (Signed)
Anesthesia Post Note ? ?Patient: Tina Best ? ?Procedure(s) Performed: TOTAL HIP ARTHROPLASTY (Left: Hip) ? ?Patient location during evaluation: PACU ?Anesthesia Type: Spinal ?Level of consciousness: awake and alert ?Pain management: pain level controlled ?Vital Signs Assessment: post-procedure vital signs reviewed and stable ?Respiratory status: spontaneous breathing, nonlabored ventilation, respiratory function stable and patient connected to nasal cannula oxygen ?Cardiovascular status: blood pressure returned to baseline and stable ?Postop Assessment: no apparent nausea or vomiting ?Anesthetic complications: no ? ? ?No notable events documented. ? ? ?Last Vitals:  ?Vitals:  ? 12/25/21 0006 12/25/21 0447  ?BP: (!) 99/57 (!) 105/54  ?Pulse: 84 82  ?Resp: 16 16  ?Temp: (!) 36.3 ?C 36.8 ?C  ?SpO2: 97% 98%  ?  ?Last Pain:  ?Vitals:  ? 12/25/21 0633  ?TempSrc:   ?PainSc: Asleep  ? ? ?  ?  ?  ?  ?  ?  ? ?Yevette Edwards ? ? ? ? ?

## 2021-12-25 NOTE — Evaluation (Signed)
Occupational Therapy Evaluation ?Patient Details ?Name: Tina Best ?MRN: [id number] ?DOB: March 18, 1946 ?Today's Date: 12/25/2021 ? ? ?History of Present Illness Pt is a 76 yo F diagnosed with degenerative arthrosis of the left hip and is s/p elective L THA.  PMH includes DM and HTN.  ? ?Clinical Impression ?  ?Patient presenting with decreased Ind in self care, balance, functional mobility/transfers, endurance, and safety awareness. Patient reports living with significant other who can assist at discharge as needed. She endorses being independent at baseline without use of AD. Pt needing further education regarding posterior hip precautions secondary to her only being able to verbalize, " I can't cross my legs". OT educating pt on use of equipment to increased independence with LB self care. She has a LH reacher at home she can utilize as well. Pt needing min A for LB self care and she stands with supervision and use of RW for mobility in room. She returns to sit in recliner chair at end of session.Patient will benefit from acute OT to increase overall independence in the areas of ADLs, functional mobility, and safety awareness in order to safely discharge home with significant other.  ?   ? ?Recommendations for follow up therapy are one component of a multi-disciplinary discharge planning process, led by the attending physician.  Recommendations may be updated based on patient status, additional functional criteria and insurance authorization.  ? ?Follow Up Recommendations ? No OT follow up  ?  ?Assistance Recommended at Discharge Set up Supervision/Assistance  ?Patient can return home with the following A little help with walking and/or transfers;A little help with bathing/dressing/bathroom;Assist for transportation;Help with stairs or ramp for entrance ? ?  ?Functional Status Assessment ? Patient has had a recent decline in their functional status and demonstrates the ability to make significant  improvements in function in a reasonable and predictable amount of time.  ?Equipment Recommendations ? BSC/3in1;Other (comment) (RW)  ?  ?   ?Precautions / Restrictions Precautions ?Precautions: Fall;Posterior Hip ?Precaution Booklet Issued: Yes (comment) ?Restrictions ?Weight Bearing Restrictions: Yes ?LLE Weight Bearing: Weight bearing as tolerated  ? ?  ? ?Mobility Bed Mobility ?Overal bed mobility: Modified Independent ?  ?  ?  ?  ?  ?  ?  ?  ? ?Transfers ?Overall transfer level: Needs assistance ?Equipment used: Rolling walker (2 wheels) ?Transfers: Sit to/from Stand ?Sit to Stand: Supervision ?  ?  ?  ?  ?  ?General transfer comment: Min verbal and visual cues for sequencing for hip precaution compliance ?  ? ?  ?Balance Overall balance assessment: Needs assistance ?Sitting-balance support: Feet unsupported, No upper extremity supported ?Sitting balance-Leahy Scale: Good ?  ?  ?Standing balance support: Bilateral upper extremity supported, During functional activity ?Standing balance-Leahy Scale: Fair ?  ?  ?  ?  ?  ?  ?  ?  ?  ?  ?  ?  ?   ? ?ADL either performed or assessed with clinical judgement  ? ?ADL Overall ADL's : Needs assistance/impaired ?  ?  ?  ?  ?  ?  ?  ?  ?  ?  ?  ?  ?  ?  ?  ?  ?  ?  ?  ?General ADL Comments: min A for LB self care to thread clothing onto L LE secondary to precautions. She does have LH reacher at home and educated on use.  ? ? ? ?Vision Patient Visual Report: No change from baseline ?   ?   ?   ?   ? ?  Pertinent Vitals/Pain Pain Assessment ?Pain Assessment: 0-10 ?Pain Score: 2  ?Pain Location: L hip ?Pain Descriptors / Indicators: Sore ?Pain Intervention(s): Monitored during session, Premedicated before session, Repositioned  ? ? ? ?Hand Dominance Right ?  ?Extremity/Trunk Assessment Upper Extremity Assessment ?Upper Extremity Assessment: Overall WFL for tasks assessed ?  ?Lower Extremity Assessment ?Lower Extremity Assessment: Generalized weakness;LLE deficits/detail ?LLE  Deficits / Details: BLE ankle AROM, sensation to light touch, and strength grossly WNL ?LLE: Unable to fully assess due to pain ?LLE Sensation: WNL ?  ?  ?  ?Communication Communication ?Communication: No difficulties ?  ?Cognition Arousal/Alertness: Awake/alert ?Behavior During Therapy: [facility] for tasks assessed/performed ?Overall Cognitive Status: Within Functional Limits for tasks assessed ?  ?  ?  ?  ?  ?  ?  ?  ?  ?  ?  ?  ?  ?  ?  ?  ?  ?  ?  ?   ?   ?   ? ? ?Home Living Family/patient expects to be discharged to:: Private residence ?Living Arrangements: Spouse/significant other ?Available Help at Discharge: Family;Available 24 hours/day ?Type of Home: House ?Home Access: Stairs to enter ?Entrance Stairs-Number of Steps: 6 ?Entrance Stairs-Rails: Left ?Home Layout: One level ?  ?  ?Bathroom Shower/Tub: Tub/shower unit ?  ?  ?  ?  ?Home Equipment: Grab bars - tub/shower;Cane - single point ?  ?  ?  ? ?  ?Prior Functioning/Environment   ?  ?  ?  ?  ?  ?  ?Mobility Comments: Mod Ind amb community distances with a SPC, no fall history ?ADLs Comments: Ind with ADLs ?  ? ?  ?  ?OT Problem List: Impaired balance (sitting and/or standing);Decreased safety awareness;Decreased knowledge of use of DME or AE;Decreased activity tolerance;Decreased range of motion;Decreased strength;Pain;Decreased knowledge of precautions ?  ?   ?OT Treatment/Interventions: Self-care/ADL training;Therapeutic exercise;Therapeutic activities;Energy conservation;Manual therapy;Balance training;Modalities;Patient/family education;DME and/or AE instruction  ?  ?OT Goals(Current goals can be found in the care plan section) Acute Rehab OT Goals ?Patient Stated Goal: to go home ?OT Goal Formulation: With patient ?Time For Goal Achievement: 01/08/22 ?Potential to Achieve Goals: Good ?ADL Goals ?Pt Will Perform Lower Body Dressing: with adaptive equipment;with modified independence ?Pt Will Transfer to Toilet: with modified independence;ambulating ?Pt  Will Perform Toileting - Clothing Manipulation and hygiene: with modified independence;sit to/from stand  ?OT Frequency: Min 2X/week ?  ? ?   ?AM-PAC OT "6 Clicks" Daily Activity     ?Outcome Measure Help from another person eating meals?: None ?Help from another person taking care of personal grooming?: None ?Help from another person toileting, which includes using toliet, bedpan, or urinal?: A Little ?Help from another person bathing (including washing, rinsing, drying)?: A Little ?Help from another person to put on and taking off regular upper body clothing?: None ?Help from another person to put on and taking off regular lower body clothing?: A Little ?6 Click Score: 21 ?  ?End of Session Equipment Utilized During Treatment: Rolling walker (2 wheels) ?Nurse Communication: Mobility status ? ?Activity Tolerance: Patient tolerated treatment well ?Patient left: in chair;with call bell/phone within reach ? ?OT Visit Diagnosis: Unsteadiness on feet (R26.81);Muscle weakness (generalized) (M62.81)  ?              ?Time: [id number] ?OT Time Calculation (min): 18 min ?Charges:  OT General Charges ?$OT Visit: 1 Visit ?OT Evaluation ?$OT Eval Low Complexity: 1 Low ?OT Treatments ?$Self Care/Home Management : 8-22 mins ? ?Jackquline DenmarkKatie Handsome Anglin,  MS, OTR/L , CBIS ?ascom [phone]  ?12/25/21, 1:10 PM  ?

## 2021-12-25 NOTE — Progress Notes (Signed)
Physical Therapy Treatment ?Patient Details ?Name: Tina Best ?MRN: [id number] ?DOB: [birth year] ?Today's Date: 12/25/2021 ? ? ?History of Present Illness Pt is a 76 yo F diagnosed with degenerative arthrosis of the left hip and is s/p elective L THA.  PMH includes DM and HTN. ? ?  ?PT Comments  ? ? Pt sitting EOB finishing up breakfast upon PT entrance into room for today's session. Pt denies any c/o pain at rest and is willing to work w/ PT today. Pt is able to perform sit to stand from bed w/ SUPERVISION using rolling walker and is able to progress to ambulating ~25ft w/ CGA using RW to rehab gym to perform stairs. Pt expresses understanding of stairs education and is able to perform stairs w/ CGA using L sided railing. Able to ambulate ~142ft w/ CGA using RW to return to room; pt left in bed w/ all needs w/in reach prior to PT exiting room. Pt will benefit from continued skilled PT in order to increase LE strength, improve mobility/gait, and restore PLOF. Current discharge recommendation to HHPT is appropriate due to the level of assistance required by the patient to ensure safety and improve overall function. ?  ?Recommendations for follow up therapy are one component of a multi-disciplinary discharge planning process, led by the attending physician.  Recommendations may be updated based on patient status, additional functional criteria and insurance authorization. ? ?Follow Up Recommendations ? Home health PT ?  ?  ?Assistance Recommended at Discharge Intermittent Supervision/Assistance  ?Patient can return home with the following A little help with walking and/or transfers;A little help with bathing/dressing/bathroom;Assistance with cooking/housework;Assist for transportation;Help with stairs or ramp for entrance ?  ?Equipment Recommendations ? Rolling walker (2 wheels);BSC/3in1  ?  ?Recommendations for Other Services   ? ? ?  ?Precautions / Restrictions Precautions ?Precautions: Fall;Posterior  Hip ?Precaution Booklet Issued: Yes (comment) ?Restrictions ?Weight Bearing Restrictions: Yes ?LLE Weight Bearing: Weight bearing as tolerated  ?  ? ?Mobility ? Bed Mobility ?  ?  ?  ?  ?  ?  ?  ?General bed mobility comments: sitting EOB upon PT entrance into room ?  ? ?Transfers ?Overall transfer level: Needs assistance ?Equipment used: Rolling walker (2 wheels) ?Transfers: Sit to/from Stand ?Sit to Stand: Supervision ?  ?  ?  ?  ?  ?  ?  ? ?Ambulation/Gait ?Ambulation/Gait assistance: Min guard ?Gait Distance (Feet): 200 Feet ?Assistive device: Rolling walker (2 wheels) ?Gait Pattern/deviations: Step-to pattern, Decreased step length - left, Antalgic ?Gait velocity: decreased ?  ?  ?  ? ? ?Stairs ?Stairs: Yes ?Stairs assistance: Min guard ?Stair Management: Forwards, One rail Left ?Number of Stairs: 4 ?  ? ? ?Wheelchair Mobility ?  ? ?Modified Rankin (Stroke Patients Only) ?  ? ? ?  ?Balance Overall balance assessment: Needs assistance ?Sitting-balance support: Feet unsupported, No upper extremity supported ?Sitting balance-Leahy Scale: Good ?  ?  ?Standing balance support: Bilateral upper extremity supported, During functional activity ?Standing balance-Leahy Scale: Fair ?  ?  ?  ?  ?  ?  ?  ?  ?  ?  ?  ?  ?  ? ?  ?Cognition Arousal/Alertness: Awake/alert ?Behavior During Therapy: [facility] for tasks assessed/performed ?Overall Cognitive Status: Within Functional Limits for tasks assessed ?  ?  ?  ?  ?  ?  ?  ?  ?  ?  ?  ?  ?  ?  ?  ?  ?  ?  ?  ? ?  ?  Exercises   ? ?  ?General Comments   ?  ?  ? ?Pertinent Vitals/Pain Pain Assessment ?Pain Assessment: Faces ?Faces Pain Scale: Hurts a little bit ?Pain Descriptors / Indicators: Sore ?Pain Intervention(s): Monitored during session, Premedicated before session, Repositioned  ? ? ?Home Living   ?  ?  ?  ?  ?  ?  ?  ?  ?  ?   ?  ?Prior Function    ?  ?  ?   ? ?PT Goals (current goals can now be found in the care plan section) Progress towards PT goals: Progressing toward  goals ? ?  ?Frequency ? ? ? BID ? ? ? ?  ?PT Plan Current plan remains appropriate  ? ? ?Co-evaluation   ?  ?  ?  ?  ? ?  ?AM-PAC PT "6 Clicks" Mobility   ?Outcome Measure ? Help needed turning from your back to your side while in a flat bed without using bedrails?: A Little ?Help needed moving from lying on your back to sitting on the side of a flat bed without using bedrails?: A Little ?Help needed moving to and from a bed to a chair (including a wheelchair)?: A Little ?Help needed standing up from a chair using your arms (e.g., wheelchair or bedside chair)?: A Little ?Help needed to walk in hospital room?: A Little ?Help needed climbing 3-5 steps with a railing? : A Little ?6 Click Score: 18 ? ?  ?End of Session Equipment Utilized During Treatment: Gait belt ?Activity Tolerance: Patient tolerated treatment well ?Patient left: with call bell/phone within reach;in bed;with bed alarm set ?Nurse Communication: Mobility status;Weight bearing status ?PT Visit Diagnosis: Other abnormalities of gait and mobility (R26.89);Muscle weakness (generalized) (M62.81);Pain ?Pain - Right/Left: Left ?Pain - part of body: Hip ?  ? ? ?Time: 0254-2706 ?PT Time Calculation (min) (ACUTE ONLY): 23 min ? ?Charges:             ?          ? ?Jeralyn Bennett, SPT ?12/25/2021, 10:40 AM ? ?

## 2021-12-26 LAB — SURGICAL PATHOLOGY

## 2022-03-08 ENCOUNTER — Other Ambulatory Visit: Payer: Self-pay | Admitting: Gerontology

## 2022-03-08 DIAGNOSIS — Z1231 Encounter for screening mammogram for malignant neoplasm of breast: Secondary | ICD-10-CM

## 2022-04-09 ENCOUNTER — Ambulatory Visit
Admission: RE | Admit: 2022-04-09 | Discharge: 2022-04-09 | Disposition: A | Discharge status: Home / Self Care | Payer: Medicare PPO | Source: Ambulatory Visit | Attending: Gerontology | Admitting: Gerontology

## 2022-04-09 DIAGNOSIS — Z1231 Encounter for screening mammogram for malignant neoplasm of breast: Secondary | ICD-10-CM | POA: Insufficient documentation

## 2022-06-22 IMAGING — DX DG HIP (WITH OR WITHOUT PELVIS) 1V PORT*L*
2 series · 2 of 2 positions shown · non-contrast
Comparison: Portable exam 9903 hours without priors for comparison

CLINICAL DATA: Post LEFT hip replacement

EXAM:
DG HIP (WITH OR WITHOUT PELVIS) 1V PORT LEFT

[pelvis ap]
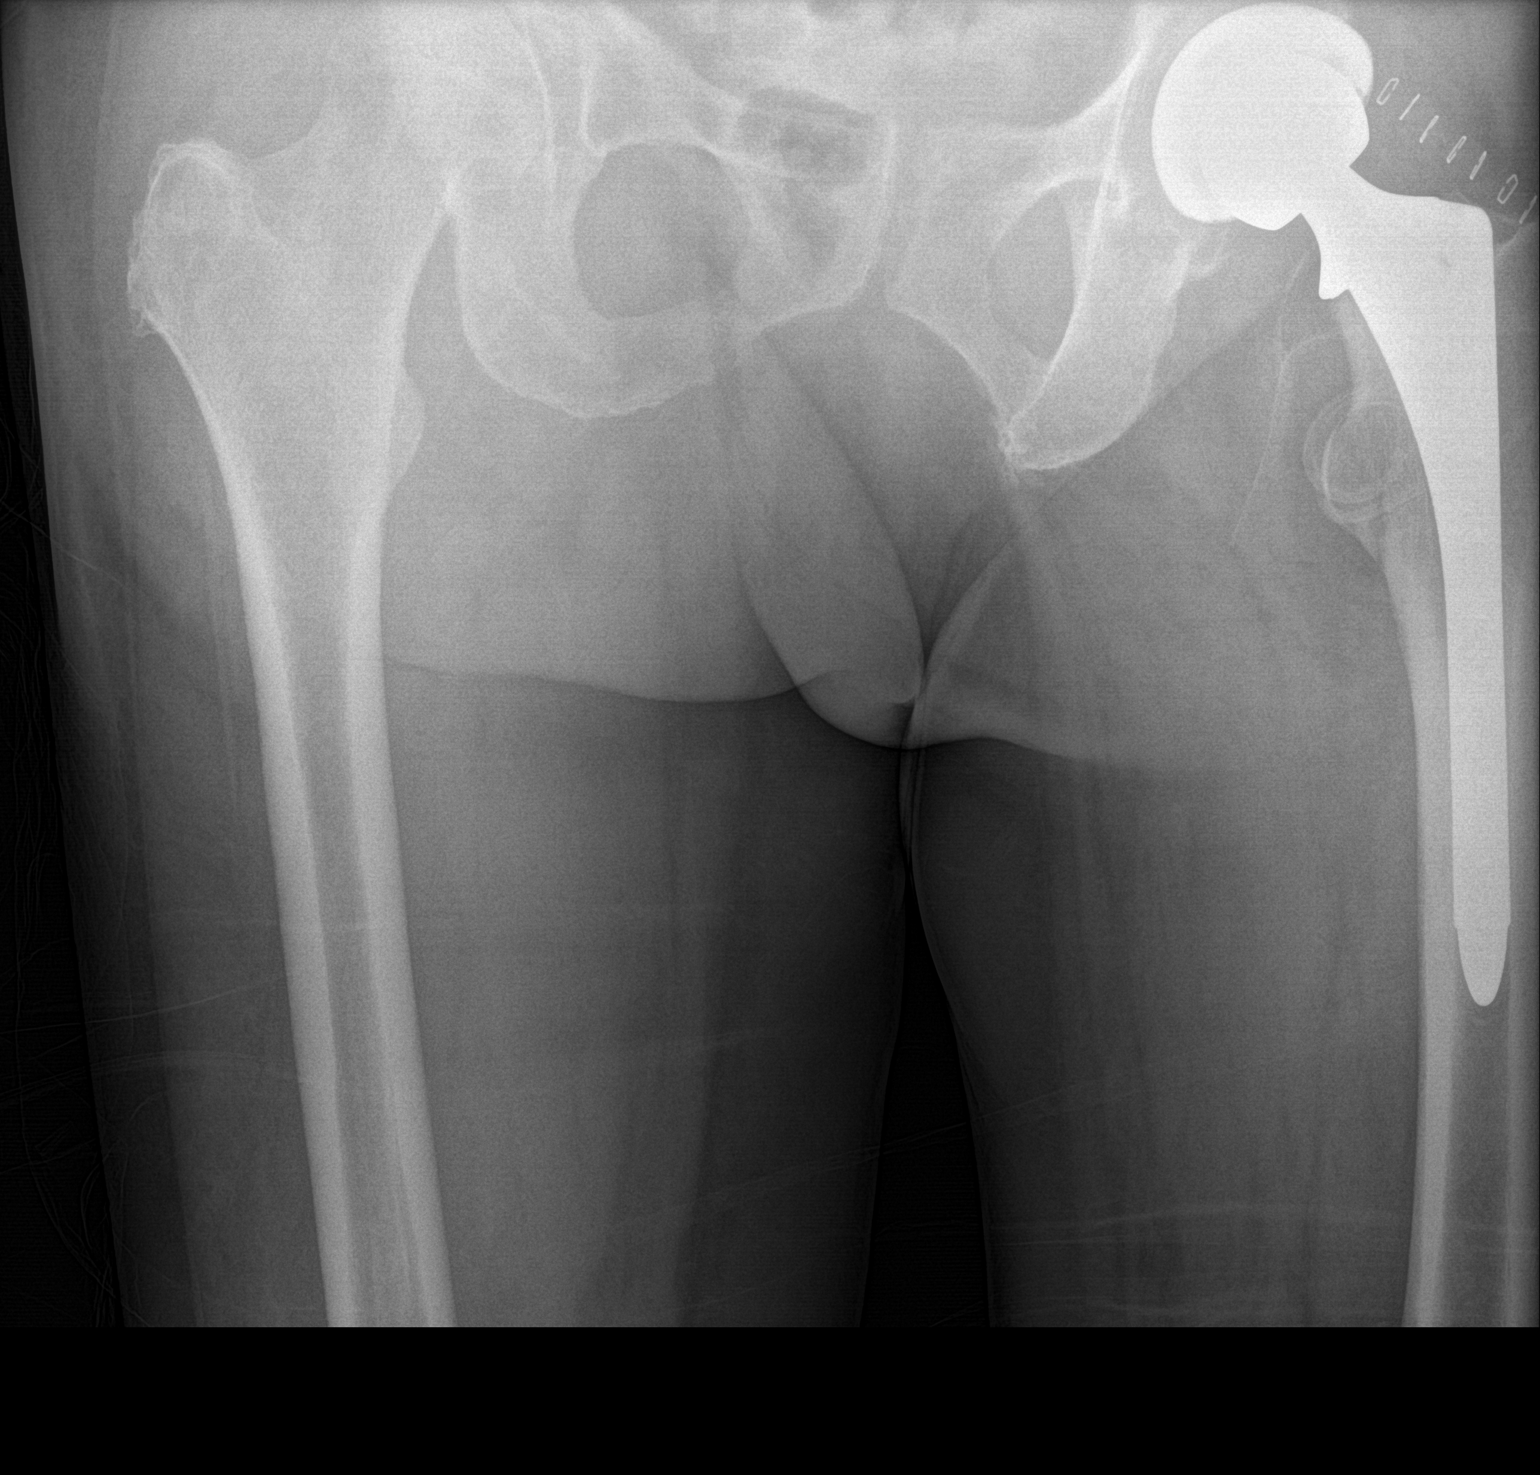

[hip lat]
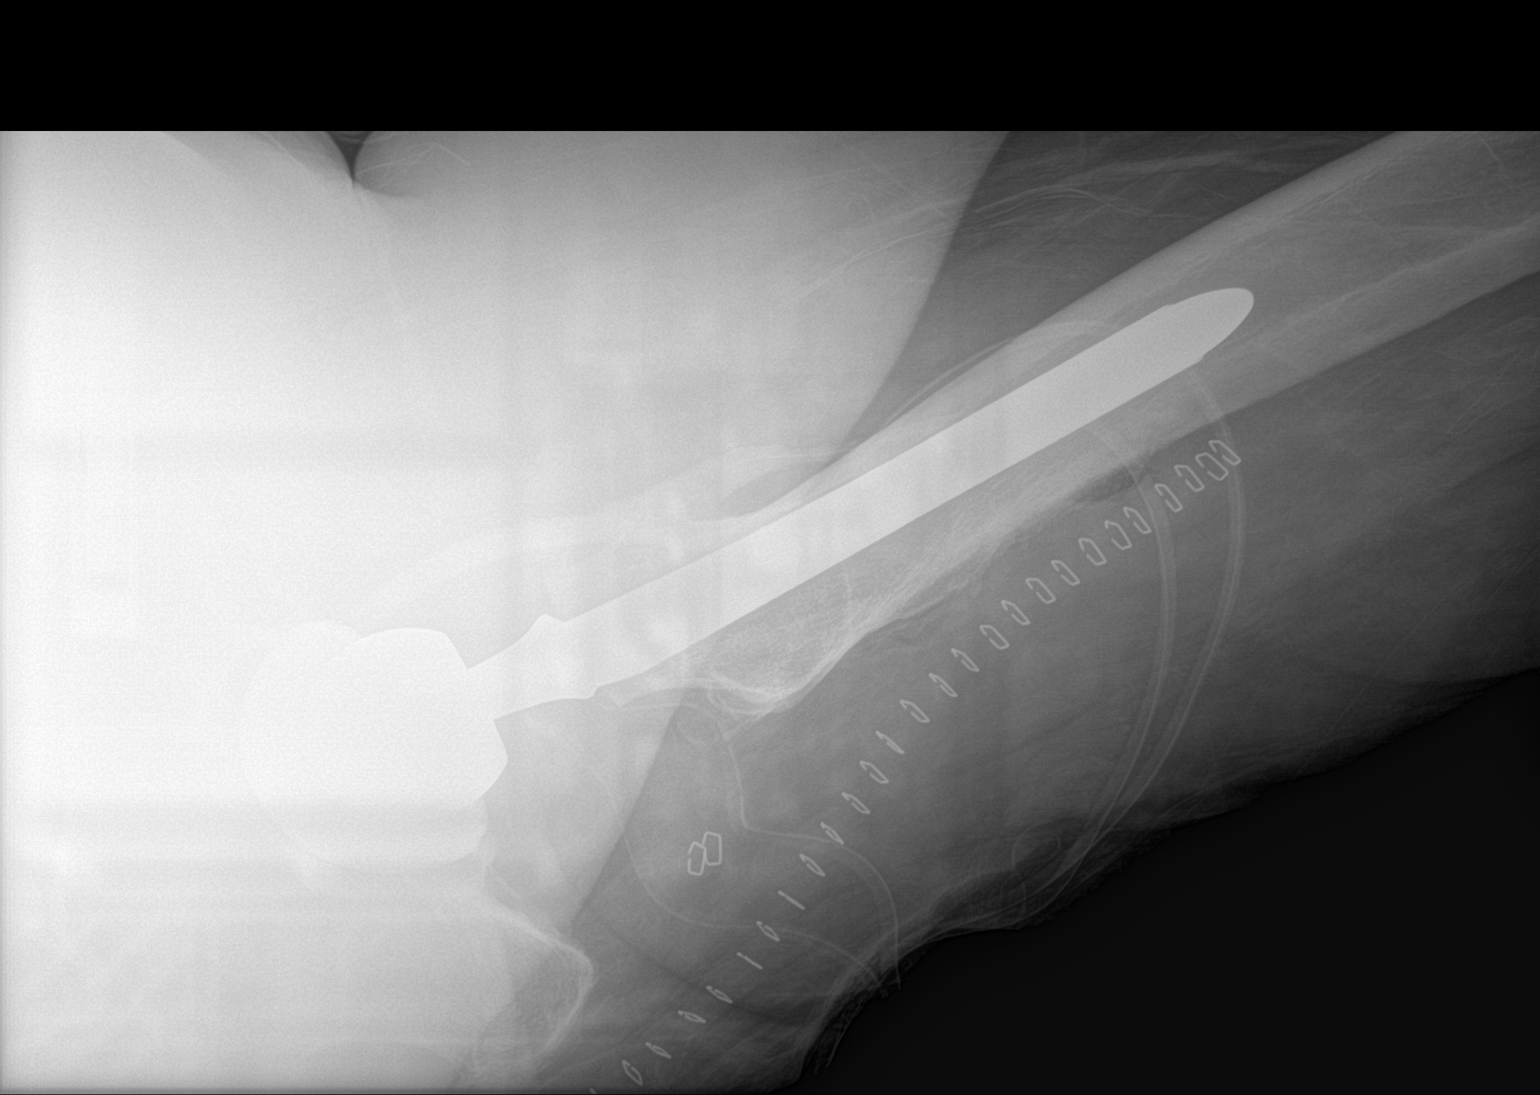

[2 of 2 positions shown; findings below may reference images not displayed]

FINDINGS: LEFT hip prosthesis identified.

Lateral margin of the proximal LEFT femur is excluded on the AP
view.

Questionable nondisplaced fracture at the lesser trochanter versus
superimposed artifact.

No additional osseous abnormality seen.

Surgical drain overlies the operative site.
IMPRESSION: Incomplete visualization of proximal LEFT femur.

LEFT hip prosthesis with question nondisplaced fracture of the
lesser trochanter versus superimposed artifact; follow-up/repeat
LEFT hip radiographs recommended.

Findings called to [place] RN on 1A on 12/24/2021 at 5553 hours.

## 2022-11-18 ENCOUNTER — Other Ambulatory Visit: Payer: Self-pay | Admitting: Gerontology

## 2022-11-18 DIAGNOSIS — Z1231 Encounter for screening mammogram for malignant neoplasm of breast: Secondary | ICD-10-CM

## 2023-01-09 DIAGNOSIS — Z96642 Presence of left artificial hip joint: Secondary | ICD-10-CM | POA: Diagnosis not present

## 2023-04-14 ENCOUNTER — Ambulatory Visit
Admission: RE | Admit: 2023-04-14 | Discharge: 2023-04-14 | Disposition: A | Discharge status: Home / Self Care | Payer: Medicare PPO | Source: Ambulatory Visit | Attending: Gerontology | Admitting: Gerontology

## 2023-04-14 DIAGNOSIS — Z1231 Encounter for screening mammogram for malignant neoplasm of breast: Secondary | ICD-10-CM | POA: Diagnosis not present

## 2023-05-16 DIAGNOSIS — Z1331 Encounter for screening for depression: Secondary | ICD-10-CM | POA: Diagnosis not present

## 2023-05-16 DIAGNOSIS — E1122 Type 2 diabetes mellitus with diabetic chronic kidney disease: Secondary | ICD-10-CM | POA: Diagnosis not present

## 2023-05-16 DIAGNOSIS — E1159 Type 2 diabetes mellitus with other circulatory complications: Secondary | ICD-10-CM | POA: Diagnosis not present

## 2023-05-16 DIAGNOSIS — Z803 Family history of malignant neoplasm of breast: Secondary | ICD-10-CM | POA: Diagnosis not present

## 2023-05-16 DIAGNOSIS — E039 Hypothyroidism, unspecified: Secondary | ICD-10-CM | POA: Diagnosis not present

## 2023-05-16 DIAGNOSIS — N1831 Chronic kidney disease, stage 3a: Secondary | ICD-10-CM | POA: Diagnosis not present

## 2023-05-16 DIAGNOSIS — E538 Deficiency of other specified B group vitamins: Secondary | ICD-10-CM | POA: Diagnosis not present

## 2023-05-16 DIAGNOSIS — Z0000 Encounter for general adult medical examination without abnormal findings: Secondary | ICD-10-CM | POA: Diagnosis not present

## 2023-05-16 DIAGNOSIS — E785 Hyperlipidemia, unspecified: Secondary | ICD-10-CM | POA: Diagnosis not present

## 2023-05-16 DIAGNOSIS — J310 Chronic rhinitis: Secondary | ICD-10-CM | POA: Diagnosis not present

## 2023-05-16 DIAGNOSIS — I152 Hypertension secondary to endocrine disorders: Secondary | ICD-10-CM | POA: Diagnosis not present

## 2023-05-16 DIAGNOSIS — E1169 Type 2 diabetes mellitus with other specified complication: Secondary | ICD-10-CM | POA: Diagnosis not present

## 2023-05-16 DIAGNOSIS — E559 Vitamin D deficiency, unspecified: Secondary | ICD-10-CM | POA: Diagnosis not present

## 2023-11-28 ENCOUNTER — Other Ambulatory Visit: Payer: Self-pay | Admitting: Gerontology

## 2023-11-28 DIAGNOSIS — J3089 Other allergic rhinitis: Secondary | ICD-10-CM | POA: Diagnosis not present

## 2023-11-28 DIAGNOSIS — Z96642 Presence of left artificial hip joint: Secondary | ICD-10-CM | POA: Diagnosis not present

## 2023-11-28 DIAGNOSIS — E538 Deficiency of other specified B group vitamins: Secondary | ICD-10-CM | POA: Diagnosis not present

## 2023-11-28 DIAGNOSIS — L570 Actinic keratosis: Secondary | ICD-10-CM | POA: Diagnosis not present

## 2023-11-28 DIAGNOSIS — I152 Hypertension secondary to endocrine disorders: Secondary | ICD-10-CM | POA: Diagnosis not present

## 2023-11-28 DIAGNOSIS — Z09 Encounter for follow-up examination after completed treatment for conditions other than malignant neoplasm: Secondary | ICD-10-CM | POA: Diagnosis not present

## 2023-11-28 DIAGNOSIS — E559 Vitamin D deficiency, unspecified: Secondary | ICD-10-CM | POA: Diagnosis not present

## 2023-11-28 DIAGNOSIS — E1159 Type 2 diabetes mellitus with other circulatory complications: Secondary | ICD-10-CM | POA: Diagnosis not present

## 2023-11-28 DIAGNOSIS — Z1231 Encounter for screening mammogram for malignant neoplasm of breast: Secondary | ICD-10-CM

## 2023-11-28 DIAGNOSIS — E1169 Type 2 diabetes mellitus with other specified complication: Secondary | ICD-10-CM | POA: Diagnosis not present

## 2023-11-28 DIAGNOSIS — N1831 Chronic kidney disease, stage 3a: Secondary | ICD-10-CM | POA: Diagnosis not present

## 2024-01-28 DIAGNOSIS — Z833 Family history of diabetes mellitus: Secondary | ICD-10-CM | POA: Diagnosis not present

## 2024-01-28 DIAGNOSIS — Z7982 Long term (current) use of aspirin: Secondary | ICD-10-CM | POA: Diagnosis not present

## 2024-01-28 DIAGNOSIS — E785 Hyperlipidemia, unspecified: Secondary | ICD-10-CM | POA: Diagnosis not present

## 2024-01-28 DIAGNOSIS — M1990 Unspecified osteoarthritis, unspecified site: Secondary | ICD-10-CM | POA: Diagnosis not present

## 2024-01-28 DIAGNOSIS — J302 Other seasonal allergic rhinitis: Secondary | ICD-10-CM | POA: Diagnosis not present

## 2024-01-28 DIAGNOSIS — I10 Essential (primary) hypertension: Secondary | ICD-10-CM | POA: Diagnosis not present

## 2024-01-28 DIAGNOSIS — E039 Hypothyroidism, unspecified: Secondary | ICD-10-CM | POA: Diagnosis not present

## 2024-01-28 DIAGNOSIS — E119 Type 2 diabetes mellitus without complications: Secondary | ICD-10-CM | POA: Diagnosis not present

## 2024-01-28 DIAGNOSIS — Z8249 Family history of ischemic heart disease and other diseases of the circulatory system: Secondary | ICD-10-CM | POA: Diagnosis not present

## 2024-02-05 DIAGNOSIS — Z96642 Presence of left artificial hip joint: Secondary | ICD-10-CM | POA: Diagnosis not present

## 2024-04-15 ENCOUNTER — Ambulatory Visit
Admission: RE | Admit: 2024-04-15 | Discharge: 2024-04-15 | Disposition: A | Discharge status: Home / Self Care | Payer: Medicare PPO | Source: Ambulatory Visit | Attending: Gerontology | Admitting: Gerontology

## 2024-04-15 DIAGNOSIS — Z1231 Encounter for screening mammogram for malignant neoplasm of breast: Secondary | ICD-10-CM | POA: Insufficient documentation

## 2024-06-03 DIAGNOSIS — E1169 Type 2 diabetes mellitus with other specified complication: Secondary | ICD-10-CM | POA: Diagnosis not present

## 2024-06-03 DIAGNOSIS — E785 Hyperlipidemia, unspecified: Secondary | ICD-10-CM | POA: Diagnosis not present

## 2024-06-03 DIAGNOSIS — L570 Actinic keratosis: Secondary | ICD-10-CM | POA: Diagnosis not present

## 2024-06-03 DIAGNOSIS — E1159 Type 2 diabetes mellitus with other circulatory complications: Secondary | ICD-10-CM | POA: Diagnosis not present

## 2024-06-03 DIAGNOSIS — J3089 Other allergic rhinitis: Secondary | ICD-10-CM | POA: Diagnosis not present

## 2024-06-03 DIAGNOSIS — E039 Hypothyroidism, unspecified: Secondary | ICD-10-CM | POA: Diagnosis not present

## 2024-06-03 DIAGNOSIS — Z803 Family history of malignant neoplasm of breast: Secondary | ICD-10-CM | POA: Diagnosis not present

## 2024-06-03 DIAGNOSIS — N1831 Chronic kidney disease, stage 3a: Secondary | ICD-10-CM | POA: Diagnosis not present

## 2024-06-03 DIAGNOSIS — Z79899 Other long term (current) drug therapy: Secondary | ICD-10-CM | POA: Diagnosis not present

## 2024-06-03 DIAGNOSIS — Z0000 Encounter for general adult medical examination without abnormal findings: Secondary | ICD-10-CM | POA: Diagnosis not present

## 2024-06-03 DIAGNOSIS — E559 Vitamin D deficiency, unspecified: Secondary | ICD-10-CM | POA: Diagnosis not present

## 2024-06-03 DIAGNOSIS — Z1331 Encounter for screening for depression: Secondary | ICD-10-CM | POA: Diagnosis not present

## 2024-06-03 DIAGNOSIS — E538 Deficiency of other specified B group vitamins: Secondary | ICD-10-CM | POA: Diagnosis not present

## 2024-06-03 DIAGNOSIS — I152 Hypertension secondary to endocrine disorders: Secondary | ICD-10-CM | POA: Diagnosis not present
